# Patient Record
Sex: Female | Born: 1980 | Race: Black or African American | Hispanic: No | Marital: Single | Smoking: Current every day smoker
Health system: Southern US, Community
[De-identification: ages and names within clinical notes are randomized; demographics above are authoritative.]

## PROBLEM LIST (undated history)

## (undated) DIAGNOSIS — Z8619 Personal history of other infectious and parasitic diseases: Secondary | ICD-10-CM

## (undated) DIAGNOSIS — N9489 Other specified conditions associated with female genital organs and menstrual cycle: Secondary | ICD-10-CM

## (undated) HISTORY — PX: NO PAST SURGERIES: SHX2092

---

## 2015-09-17 ENCOUNTER — Emergency Department (HOSPITAL_COMMUNITY)
Admission: EM | Admit: 2015-09-17 | Discharge: 2015-09-17 | Disposition: A | Discharge status: Home / Self Care | Payer: Self-pay | Source: Home / Self Care | Attending: Family Medicine | Admitting: Family Medicine

## 2015-09-17 ENCOUNTER — Encounter (HOSPITAL_COMMUNITY): Payer: Self-pay | Admitting: Emergency Medicine

## 2015-09-17 ENCOUNTER — Encounter (HOSPITAL_COMMUNITY): Payer: Self-pay | Admitting: Family Medicine

## 2015-09-17 ENCOUNTER — Emergency Department (HOSPITAL_COMMUNITY)
Admission: EM | Admit: 2015-09-17 | Discharge: 2015-09-17 | Disposition: A | Discharge status: Home / Self Care | Payer: Self-pay | Attending: Emergency Medicine | Admitting: Emergency Medicine

## 2015-09-17 DIAGNOSIS — R109 Unspecified abdominal pain: Secondary | ICD-10-CM | POA: Insufficient documentation

## 2015-09-17 DIAGNOSIS — F17200 Nicotine dependence, unspecified, uncomplicated: Secondary | ICD-10-CM | POA: Insufficient documentation

## 2015-09-17 DIAGNOSIS — J029 Acute pharyngitis, unspecified: Secondary | ICD-10-CM

## 2015-09-17 DIAGNOSIS — R102 Pelvic and perineal pain: Secondary | ICD-10-CM

## 2015-09-17 LAB — COMPREHENSIVE METABOLIC PANEL
ALK PHOS: 61 U/L (ref 38–126)
ALT: 19 U/L (ref 14–54)
ANION GAP: 9 (ref 5–15)
AST: 22 U/L (ref 15–41)
Albumin: 3.2 g/dL — ABNORMAL LOW (ref 3.5–5.0)
BILIRUBIN TOTAL: 0.7 mg/dL (ref 0.3–1.2)
BUN: 5 mg/dL — ABNORMAL LOW (ref 6–20)
CALCIUM: 9.5 mg/dL (ref 8.9–10.3)
CO2: 25 mmol/L (ref 22–32)
CREATININE: 0.67 mg/dL (ref 0.44–1.00)
Chloride: 103 mmol/L (ref 101–111)
Glucose, Bld: 111 mg/dL — ABNORMAL HIGH (ref 65–99)
Potassium: 4.4 mmol/L (ref 3.5–5.1)
SODIUM: 137 mmol/L (ref 135–145)
TOTAL PROTEIN: 7.3 g/dL (ref 6.5–8.1)

## 2015-09-17 LAB — I-STAT BETA HCG BLOOD, ED (MC, WL, AP ONLY)

## 2015-09-17 LAB — POCT URINALYSIS DIP (DEVICE)
BILIRUBIN URINE: NEGATIVE
Glucose, UA: NEGATIVE mg/dL
Ketones, ur: NEGATIVE mg/dL
NITRITE: NEGATIVE
PH: 8.5 — AB (ref 5.0–8.0)
Protein, ur: NEGATIVE mg/dL
Specific Gravity, Urine: 1.02 (ref 1.005–1.030)
UROBILINOGEN UA: 1 mg/dL (ref 0.0–1.0)

## 2015-09-17 LAB — CBC
HCT: 33.6 % — ABNORMAL LOW (ref 36.0–46.0)
HEMOGLOBIN: 10.7 g/dL — AB (ref 12.0–15.0)
MCH: 23.8 pg — ABNORMAL LOW (ref 26.0–34.0)
MCHC: 31.8 g/dL (ref 30.0–36.0)
MCV: 74.8 fL — ABNORMAL LOW (ref 78.0–100.0)
PLATELETS: 360 10*3/uL (ref 150–400)
RBC: 4.49 MIL/uL (ref 3.87–5.11)
RDW: 18 % — ABNORMAL HIGH (ref 11.5–15.5)
WBC: 10.3 10*3/uL (ref 4.0–10.5)

## 2015-09-17 LAB — LIPASE, BLOOD: Lipase: 30 U/L (ref 11–51)

## 2015-09-17 MED ORDER — NAPROXEN SODIUM 550 MG PO TABS: 550.0000 mg | ORAL_TABLET | Freq: Two times a day (BID) | ORAL | Status: DC

## 2015-09-17 MED ORDER — KETOROLAC TROMETHAMINE 60 MG/2ML IM SOLN
INTRAMUSCULAR | Status: AC
Start: 2015-09-17 — End: 2015-09-17
  Filled 2015-09-17: qty 2

## 2015-09-17 MED ORDER — KETOROLAC TROMETHAMINE 60 MG/2ML IM SOLN
60.0000 mg | Freq: Once | INTRAMUSCULAR | Status: AC
  Administered 2015-09-17: 60 mg via INTRAMUSCULAR

## 2015-09-17 NOTE — ED Notes (Signed)
Pt here for right side pain, urinary frequency, sts back pain and pressure.

## 2015-09-17 NOTE — ED Notes (Signed)
Low abdomen, low back pain for 3 days.  Pressure with urination.  Last bm was this morning and normal .  Denies vaginal discharge .  History of uti, but has not felt like this before.  Reports nausea, no vomiting

## 2015-09-17 NOTE — ED Notes (Signed)
Patients come up to nurse first a little irritated about current wait time. Proceeds to throw BP cuff on the counter and walks out the door

## 2015-09-17 NOTE — Discharge Instructions (Signed)
Pelvic Pain, Female Pelvic pain is pain felt below the belly button and between your hips. It can be caused by many different things. It is important to get help right away. This is especially true for severe, sharp, or unusual pain that comes on suddenly.  HOME CARE  Only take medicine as told by your doctor.  Rest as told by your doctor.  Eat a healthy diet, such as fruits, vegetables, and lean meats.  Drink enough fluids to keep your pee (urine) clear or pale yellow, or as told.  Avoid sex (intercourse) if it causes pain.  Apply warm or cold packs to your lower belly (abdomen). Use the type of pack that helps the pain.  Avoid situations that cause you stress.  Keep a journal to track your pain. Write down:  When the pain started.  Where it is located.  If there are things that seem to be related to the pain, such as food or your period.  Follow up with your doctor as told. GET HELP RIGHT AWAY IF:   You have heavy bleeding from the vagina.  You have more pelvic pain.  You feel lightheaded or pass out (faint).  You have chills.  You have pain when you pee or have blood in your pee.  You cannot stop having watery poop (diarrhea).  You cannot stop throwing up (vomiting).  You have a fever or lasting symptoms for more than 3 days.  You have a fever and your symptoms suddenly get worse.  You are being physically or sexually abused.  Your medicine does not help your pain.  You have fluid (discharge) coming from your vagina that is not normal. MAKE SURE YOU:  Understand these instructions.  Will watch your condition.  Will get help if you are not doing well or get worse.   This information is not intended to replace advice given to you by your health care provider. Make sure you discuss any questions you have with your health care provider.   Document Released: 12/22/2007 Document Revised: 07/26/2014 Document Reviewed: 10/25/2011 Elsevier Interactive  Patient Education 2016 Elsevier Inc. Abdominal Pain, Adult Many things can cause belly (abdominal) pain. Most times, the belly pain is not dangerous. Many cases of belly pain can be watched and treated at home. HOME CARE   Do not take medicines that help you go poop (laxatives) unless told to by your doctor.  Only take medicine as told by your doctor.  Eat or drink as told by your doctor. Your doctor will tell you if you should be on a special diet. GET HELP IF:  You do not know what is causing your belly pain.  You have belly pain while you are sick to your stomach (nauseous) or have runny poop (diarrhea).  You have pain while you pee or poop.  Your belly pain wakes you up at night.  You have belly pain that gets worse or better when you eat.  You have belly pain that gets worse when you eat fatty foods.  You have a fever. GET HELP RIGHT AWAY IF:   The pain does not go away within 2 hours.  You keep throwing up (vomiting).  The pain changes and is only in the right or left part of the belly.  You have bloody or tarry looking poop. MAKE SURE YOU:   Understand these instructions.  Will watch your condition.  Will get help right away if you are not doing well or get worse.  This information is not intended to replace advice given to you by your health care provider. Make sure you discuss any questions you have with your health care provider.   Document Released: 12/22/2007 Document Revised: 07/26/2014 Document Reviewed: 03/14/2013 Elsevier Interactive Patient Education [employer]

## 2015-09-17 NOTE — ED Provider Notes (Signed)
CSN: [id number]     Arrival date & time 09/17/15  1649 History   First MD Initiated Contact with Patient 09/17/15 1749     Chief Complaint  Patient presents with  . Abdominal Pain   (Consider location/radiation/quality/duration/timing/severity/associated sxs/prior Treatment) HPI History obtained from patient:   LOCATION:right pelvic SEVERITY:6 DURATION:several days CONTEXT:sudden onset QUALITY: MODIFYING FACTORS:OTC meds without relief ASSOCIATED SYMPTOMS: some nausea TIMING:constant OCCUPATION:  History reviewed. No pertinent past medical history. History reviewed. No pertinent past surgical history. No family history on file. Social History  Substance Use Topics  . Smoking status: Current Every Day Smoker  . Smokeless tobacco: None  . Alcohol Use: No   OB History    No data available     Review of Systems ROS +'ve abdominal/pelvic pain  Denies: HEADACHE, NAUSEA,   CHEST PAIN, CONGESTION, DYSURIA, SHORTNESS OF BREATH   Allergies  Review of patient's allergies indicates no known allergies.  Home Medications   Prior to Admission medications   Medication Sig Start Date End Date Taking? Authorizing Provider  Naproxen Sodium (ALEVE PO) Take by mouth.   Yes Historical Provider, MD  naproxen sodium (ANAPROX DS) 550 MG tablet Take 1 tablet (550 mg total) by mouth 2 (two) times daily with a meal. 09/17/15   Tharon Aquas, PA   Meds Ordered and Administered this Visit   Medications  ketorolac (TORADOL) injection 60 mg (60 mg Intramuscular Given 09/17/15 1810)    BP 115/74 mmHg  Pulse 87  Temp(Src) 100 F (37.8 C) (Oral)  Resp 18  SpO2 100%  LMP 09/03/2015 No data found.   Physical Exam NURSES NOTES AND VITAL SIGNS REVIEWED. CONSTITUTIONAL: Well developed, well nourished, no acute distress HEENT: normocephalic, atraumatic, right and left TM's are normal EYES: Conjunctiva normal NECK:normal ROM, supple, no adenopathy PULMONARY:No respiratory distress, normal  effort, Lungs: CTAb/l, no wheezes, or increased work of breathing CARDIOVASCULAR: RRR, no murmur ABDOMEN: soft, ND, NT, +'ve BS, without CVA -T MUSCULOSKELETAL: Normal ROM of all extremities,  SKIN: warm and dry without rash PSYCHIATRIC: Mood and affect, behavior are normal No pelvic exam performed.  ED Course  Procedures (including critical care time)  Labs Review Labs Reviewed  POCT URINALYSIS DIP (DEVICE) - Abnormal; Notable for the following:    Hgb urine dipstick SMALL (*)    pH 8.5 (*)    Leukocytes, UA SMALL (*)    All other components within normal limits    Imaging Review No results found.   Visual Acuity Review  Right Eye Distance:   Left Eye Distance:   Bilateral Distance:    Right Eye Near:   Left Eye Near:    Bilateral Near:       Discussed UA, Urine HCG and bloods done at ER all normal. I have advised pt that if she continues to have discomfort, she should go to [facility] hospital for further evaluation.   MDM   1. Pelvic pain in female     Patient is reassured that there are no issues that require transfer to higher level of care at this time.  Patient is advised to continue home symptomatic treatment. Prescription is sent to  pharmacy patient has indicated.  anaprox Patient is advised that if there are new or worsening symptoms or attend the emergency department, or contact primary care provider. Instructions of care provided discharged home in stable condition. Return to work/school note provided.  THIS NOTE WAS GENERATED USING A VOICE RECOGNITION SOFTWARE PROGRAM. ALL REASONABLE EFFORTS  WERE  MADE TO PROOFREAD THIS DOCUMENT FOR ACCURACY.     Tharon Aquas, PA 09/17/15 2023

## 2015-09-18 ENCOUNTER — Emergency Department (HOSPITAL_COMMUNITY)
Admission: EM | Admit: 2015-09-18 | Discharge: 2015-09-18 | Disposition: A | Discharge status: Home / Self Care | Payer: Self-pay | Attending: Emergency Medicine | Admitting: Emergency Medicine

## 2015-09-18 ENCOUNTER — Emergency Department (HOSPITAL_COMMUNITY): Payer: Self-pay

## 2015-09-18 ENCOUNTER — Encounter (HOSPITAL_COMMUNITY): Payer: Self-pay

## 2015-09-18 DIAGNOSIS — Z791 Long term (current) use of non-steroidal anti-inflammatories (NSAID): Secondary | ICD-10-CM | POA: Insufficient documentation

## 2015-09-18 DIAGNOSIS — N730 Acute parametritis and pelvic cellulitis: Secondary | ICD-10-CM

## 2015-09-18 DIAGNOSIS — N7093 Salpingitis and oophoritis, unspecified: Secondary | ICD-10-CM | POA: Insufficient documentation

## 2015-09-18 DIAGNOSIS — F17200 Nicotine dependence, unspecified, uncomplicated: Secondary | ICD-10-CM | POA: Insufficient documentation

## 2015-09-18 DIAGNOSIS — N739 Female pelvic inflammatory disease, unspecified: Secondary | ICD-10-CM | POA: Insufficient documentation

## 2015-09-18 DIAGNOSIS — Z3202 Encounter for pregnancy test, result negative: Secondary | ICD-10-CM | POA: Insufficient documentation

## 2015-09-18 DIAGNOSIS — R102 Pelvic and perineal pain: Secondary | ICD-10-CM

## 2015-09-18 LAB — CBC WITH DIFFERENTIAL/PLATELET
Basophils Absolute: 0.1 10*3/uL (ref 0.0–0.1)
Basophils Relative: 1 %
EOS PCT: 1 %
Eosinophils Absolute: 0.1 10*3/uL (ref 0.0–0.7)
HCT: 32.6 % — ABNORMAL LOW (ref 36.0–46.0)
HEMOGLOBIN: 10.5 g/dL — AB (ref 12.0–15.0)
LYMPHS ABS: 2.1 10*3/uL (ref 0.7–4.0)
LYMPHS PCT: 20 %
MCH: 24.2 pg — AB (ref 26.0–34.0)
MCHC: 32.2 g/dL (ref 30.0–36.0)
MCV: 75.3 fL — AB (ref 78.0–100.0)
Monocytes Absolute: 1.6 10*3/uL — ABNORMAL HIGH (ref 0.1–1.0)
Monocytes Relative: 16 %
Neutro Abs: 6.4 10*3/uL (ref 1.7–7.7)
Neutrophils Relative %: 62 %
PLATELETS: 351 10*3/uL (ref 150–400)
RBC: 4.33 MIL/uL (ref 3.87–5.11)
RDW: 17.9 % — ABNORMAL HIGH (ref 11.5–15.5)
WBC: 10.1 10*3/uL (ref 4.0–10.5)

## 2015-09-18 LAB — BASIC METABOLIC PANEL
Anion gap: 13 (ref 5–15)
BUN: 6 mg/dL (ref 6–20)
CHLORIDE: 106 mmol/L (ref 101–111)
CO2: 24 mmol/L (ref 22–32)
Calcium: 9.5 mg/dL (ref 8.9–10.3)
Creatinine, Ser: 0.62 mg/dL (ref 0.44–1.00)
GFR calc Af Amer: >60 mL/min (ref >60)
GFR calc non Af Amer: >60 mL/min (ref >60)
GLUCOSE: 99 mg/dL (ref 65–99)
POTASSIUM: 4.8 mmol/L (ref 3.5–5.1)
Sodium: 143 mmol/L (ref 135–145)

## 2015-09-18 LAB — URINALYSIS, ROUTINE W REFLEX MICROSCOPIC
BILIRUBIN URINE: NEGATIVE
Glucose, UA: NEGATIVE mg/dL
HGB URINE DIPSTICK: NEGATIVE
Ketones, ur: NEGATIVE mg/dL
Nitrite: NEGATIVE
PROTEIN: NEGATIVE mg/dL
Specific Gravity, Urine: 1.017 (ref 1.005–1.030)
pH: 7.5 (ref 5.0–8.0)

## 2015-09-18 LAB — URINE MICROSCOPIC-ADD ON: RBC / HPF: NONE SEEN RBC/hpf (ref 0–5)

## 2015-09-18 LAB — WET PREP, GENITAL
SPERM: NONE SEEN
Trich, Wet Prep: NONE SEEN
Yeast Wet Prep HPF POC: NONE SEEN

## 2015-09-18 LAB — I-STAT BETA HCG BLOOD, ED (MC, WL, AP ONLY)

## 2015-09-18 MED ORDER — HYDROCODONE-ACETAMINOPHEN 5-325 MG PO TABS
1.0000 | ORAL_TABLET | Freq: Once | ORAL | Status: AC
Start: 2015-09-18 — End: 2015-09-18
  Administered 2015-09-18: 1 via ORAL
  Filled 2015-09-18: qty 1

## 2015-09-18 MED ORDER — ONDANSETRON HCL 4 MG PO TABS: 4.0000 mg | ORAL_TABLET | Freq: Three times a day (TID) | ORAL | Status: DC | PRN

## 2015-09-18 MED ORDER — MORPHINE SULFATE (PF) 4 MG/ML IV SOLN
4.0000 mg | Freq: Once | INTRAVENOUS | Status: DC
  Filled 2015-09-18: qty 1

## 2015-09-18 MED ORDER — CEFTRIAXONE SODIUM 250 MG IJ SOLR
250.0000 mg | Freq: Once | INTRAMUSCULAR | Status: AC
  Administered 2015-09-18: 250 mg via INTRAMUSCULAR
  Filled 2015-09-18: qty 250

## 2015-09-18 MED ORDER — HYDROCODONE-ACETAMINOPHEN 5-325 MG PO TABS: 1.0000 | ORAL_TABLET | ORAL | Status: DC | PRN

## 2015-09-18 MED ORDER — ONDANSETRON 4 MG PO TBDP
8.0000 mg | ORAL_TABLET | Freq: Once | ORAL | Status: AC
  Administered 2015-09-18: 8 mg via ORAL
  Filled 2015-09-18: qty 2

## 2015-09-18 MED ORDER — AZITHROMYCIN 250 MG PO TABS
1000.0000 mg | ORAL_TABLET | Freq: Once | ORAL | Status: AC
  Administered 2015-09-18: 1000 mg via ORAL
  Filled 2015-09-18: qty 4

## 2015-09-18 MED ORDER — STERILE WATER FOR INJECTION IJ SOLN
INTRAMUSCULAR | Status: AC
  Administered 2015-09-18: 0.9 mL
  Filled 2015-09-18: qty 10

## 2015-09-18 MED ORDER — METRONIDAZOLE 500 MG PO TABS: 500.0000 mg | ORAL_TABLET | Freq: Two times a day (BID) | ORAL | Status: DC

## 2015-09-18 MED ORDER — DOXYCYCLINE HYCLATE 100 MG PO CAPS: 100.0000 mg | ORAL_CAPSULE | Freq: Two times a day (BID) | ORAL | Status: DC

## 2015-09-18 NOTE — ED Notes (Signed)
Patient here with ongoing abdominal pain x 4 days, seen at urgent care yesterday and had urinalysis. Reports she had pain shot and last pm pain returned

## 2015-09-18 NOTE — Discharge Instructions (Signed)
Read the information below.  Use the prescribed medication as directed.  Please discuss all new medications with your pharmacist.  Do not take additional tylenol while taking the prescribed pain medication to avoid overdose.  You may return to the Emergency Department at any time for worsening condition or any new symptoms that concern you.    If you develop high fevers, worsening abdominal pain, uncontrolled vomiting, or are unable to tolerate fluids by mouth, return to the ER for a recheck.    You have a collection of pus in your pelvis and associated with your right ovary.  Please take all of the antibiotics until they are gone (2 weeks).  [facility] will call you with an appointment early next week.  Please go directly to [facility] for worsening condition.     Pelvic Inflammatory Disease Pelvic inflammatory disease (PID) refers to an infection in some or all of the female organs. The infection can be in the uterus, ovaries, fallopian tubes, or the surrounding tissues in the pelvis. PID can cause abdominal or pelvic pain that comes on suddenly (acute pelvic pain). PID is a serious infection because it can lead to lasting (chronic) pelvic pain or the inability to have children (infertility). CAUSES This condition is most often caused by an infection that is spread during sexual contact. However, the infection can also be caused by the normal bacteria that are found in the vaginal tissues if these bacteria travel upward into the reproductive organs. PID can also occur following:  The birth of a baby.  A miscarriage.  An abortion.  Major pelvic surgery.  The use of an intrauterine device (IUD).  A sexual assault. RISK FACTORS This condition is more likely to develop in women who:  Are younger than 35 years of age.  Are sexually active at [facility] age.  Use nonbarrier contraception.  Have multiple sexual partners.  Have sex with someone who has symptoms  of an STD (sexually transmitted disease).  Use oral contraception. At times, certain behaviors can also increase the possibility of getting PID, such as:  Using a vaginal douche.  Having an IUD in place. SYMPTOMS Symptoms of this condition include:  Abdominal or pelvic pain.  Fever.  Chills.  Abnormal vaginal discharge.  Abnormal uterine bleeding.  Unusual pain shortly after the end of a menstrual period.  Painful urination.  Pain with sexual intercourse.  Nausea and vomiting. DIAGNOSIS To diagnose this condition, your health care provider will do a physical exam and take your medical history. A pelvic exam typically reveals great tenderness in the uterus and the surrounding pelvic tissues. You may also have tests, such as:  Lab tests, including a pregnancy test, blood tests, and urine test.  Culture tests of the vagina and cervix to check for an STD.  Ultrasound.  A laparoscopic procedure to look inside the pelvis.  Examining vaginal secretions under a microscope. TREATMENT Treatment for this condition may involve one or more approaches.  Antibiotic medicines may be prescribed to be taken by mouth.  Sexual partners may need to be treated if the infection is caused by an STD.  For more severe cases, hospitalization may be needed to give antibiotics directly into a vein through an IV tube.  Surgery may be needed if other treatments do not help, but this is rare. It may take weeks until you are completely well. If you are diagnosed with PID, you should also be checked for human immunodeficiency virus (HIV). Your  health care provider may test you for infection again 3 months after treatment. You should not have unprotected sex. HOME CARE INSTRUCTIONS  Take over-the-counter and prescription medicines only as told by your health care provider.  If you were prescribed an antibiotic medicine, take it as told by your health care provider. Do not stop taking the  antibiotic even if you start to feel better.  Do not have sexual intercourse until treatment is completed or as told by your health care provider. If PID is confirmed, your recent sexual partners will need treatment, especially if you had unprotected sex.  Keep all follow-up visits as told by your health care provider. This is important. SEEK MEDICAL CARE IF:  You have increased or abnormal vaginal discharge.  Your pain does not improve.  You vomit.  You have a fever.  You cannot tolerate your medicines.  Your partner has an STD.  You have pain when you urinate. SEEK IMMEDIATE MEDICAL CARE IF:  You have increased abdominal or pelvic pain.  You have chills.  Your symptoms are not better in 72 hours even with treatment.   This information is not intended to replace advice given to you by your health care provider. Make sure you discuss any questions you have with your health care provider.   Document Released: 07/05/2005 Document Revised: 03/26/2015 Document Reviewed: 08/12/2014 Elsevier Interactive Patient Education 2016 [employer].  Sexually Transmitted Disease A sexually transmitted disease (STD) is a disease or infection that may be passed (transmitted) from person to person, usually during sexual activity. This may happen by way of saliva, semen, blood, vaginal mucus, or urine. Common STDs include:  Gonorrhea.  Chlamydia.  Syphilis.  HIV and AIDS.  Genital herpes.  Hepatitis B and C.  Trichomonas.  Human papillomavirus (HPV).  Pubic lice.  Scabies.  Mites.  Bacterial vaginosis. WHAT ARE CAUSES OF STDs? An STD may be caused by bacteria, a virus, or parasites. STDs are often transmitted during sexual activity if one person is infected. However, they may also be transmitted through nonsexual means. STDs may be transmitted after:   Sexual intercourse with an infected person.  Sharing sex toys with an infected person.  Sharing needles with an  infected person or using unclean piercing or tattoo needles.  Having intimate contact with the genitals, mouth, or rectal areas of an infected person.  Exposure to infected fluids during birth. WHAT ARE THE SIGNS AND SYMPTOMS OF STDs? Different STDs have different symptoms. Some people may not have any symptoms. If symptoms are present, they may include:  Painful or bloody urination.  Pain in the pelvis, abdomen, vagina, anus, throat, or eyes.  A skin rash, itching, or irritation.  Growths, ulcerations, blisters, or sores in the genital and anal areas.  Abnormal vaginal discharge with or without bad odor.  Penile discharge in men.  Fever.  Pain or bleeding during sexual intercourse.  Swollen glands in the groin area.  Yellow skin and eyes (jaundice). This is seen with hepatitis.  Swollen testicles.  Infertility.  Sores and blisters in the mouth. HOW ARE STDs DIAGNOSED? To make a diagnosis, your health care provider may:  Take a medical history.  Perform a physical exam.  Take a sample of any discharge to examine.  Swab the throat, cervix, opening to the penis, rectum, or vagina for testing.  Test a sample of your first morning urine.  Perform blood tests.  Perform a Pap test, if this applies.  Perform a colposcopy.  Perform  a laparoscopy. HOW ARE STDs TREATED? Treatment depends on the STD. Some STDs may be treated but not cured.  Chlamydia, gonorrhea, trichomonas, and syphilis can be cured with antibiotic medicine.  Genital herpes, hepatitis, and HIV can be treated, but not cured, with prescribed medicines. The medicines lessen symptoms.  Genital warts from HPV can be treated with medicine or by freezing, burning (electrocautery), or surgery. Warts may come back.  HPV cannot be cured with medicine or surgery. However, abnormal areas may be removed from the cervix, vagina, or vulva.  If your diagnosis is confirmed, your recent sexual partners need  treatment. This is true even if they are symptom-free or have a negative culture or evaluation. They should not have sex until their health care providers say it is okay.  Your health care provider may test you for infection again 3 months after treatment. HOW CAN I REDUCE MY RISK OF GETTING AN STD? Take these steps to reduce your risk of getting an STD:  Use latex condoms, dental dams, and water-soluble lubricants during sexual activity. Do not use petroleum jelly or oils.  Avoid having multiple sex partners.  Do not have sex with someone who has other sex partners  Do not have sex with anyone you do not know or who is at high risk for an STD.  Avoid risky sex practices that can break your skin.  Do not have sex if you have open sores on your mouth or skin.  Avoid drinking too much alcohol or taking illegal drugs. Alcohol and drugs can affect your judgment and put you in a vulnerable position.  Avoid engaging in oral and anal sex acts.  Get vaccinated for HPV and hepatitis. If you have not received these vaccines in the past, talk to your health care provider about whether one or both might be right for you.  If you are at risk of being infected with HIV, it is recommended that you take a prescription medicine daily to prevent HIV infection. This is called pre-exposure prophylaxis (PrEP). You are considered at risk if:  You are a man who has sex with other men (MSM).  You are a heterosexual man or woman and are sexually active with more than one partner.  You take drugs by injection.  You are sexually active with a partner who has HIV.  Talk with your health care provider about whether you are at high risk of being infected with HIV. If you choose to begin PrEP, you should first be tested for HIV. You should then be tested every 3 months for as long as you are taking PrEP. WHAT SHOULD I DO IF I THINK I HAVE AN STD?  See your health care provider.  Tell your sexual partner(s).  They should be tested and treated for any STDs.  Do not have sex until your health care provider says it is okay. WHEN SHOULD I GET IMMEDIATE MEDICAL CARE? Contact your health care provider right away if:   You have severe abdominal pain.  You are a man and notice swelling or pain in your testicles.  You are a woman and notice swelling or pain in your vagina.   This information is not intended to replace advice given to you by your health care provider. Make sure you discuss any questions you have with your health care provider.   Document Released: 09/25/2002 Document Revised: 07/26/2014 Document Reviewed: 01/23/2013 Elsevier Interactive Patient Education [employer]

## 2015-09-18 NOTE — ED Notes (Signed)
Pt stable, ambulatory, states understanding of discharge instructions 

## 2015-09-18 NOTE — ED Provider Notes (Signed)
CSN: [id number]     Arrival date & time 09/18/15  7253 History   First MD Initiated Contact with Patient 09/18/15 1034     Chief Complaint  Patient presents with  . Abdominal Pain     (Consider location/radiation/quality/duration/timing/severity/associated sxs/prior Treatment) The history is provided by the patient.      Patient presents with 4 days of lower abdominal pain that is sharp and constant, associated dysuria, urgency, decreased urination, bad odor from the vagina, constipation.  Was seen at urgent care yesterday after given toradol, which relieved symptoms temporarily but this morning symptoms worsened.  Noted to have fever yesterday.  Denies N/V, vaginal discharge.  Her female sexual partner of two years recently had a change in behavior, suggesting they both get tested for STIs and that they use condoms.  Denies abdominal surgeries.    History reviewed. No pertinent past medical history. History reviewed. No pertinent past surgical history. No family history on file. Social History  Substance Use Topics  . Smoking status: Current Every Day Smoker  . Smokeless tobacco: None  . Alcohol Use: No   OB History    No data available     Review of Systems  All other systems reviewed and are negative.     Allergies  Review of patient's allergies indicates no known allergies.  Home Medications   Prior to Admission medications   Medication Sig Start Date End Date Taking? Authorizing Provider  Naproxen Sodium (ALEVE PO) Take by mouth.    Historical Provider, MD  naproxen sodium (ANAPROX DS) 550 MG tablet Take 1 tablet (550 mg total) by mouth 2 (two) times daily with a meal. 09/17/15   Tharon Aquas, PA   BP 131/100 mmHg  Pulse 107  Temp(Src) 99.1 F (37.3 C)  Resp 16  Ht  (1.575 m)  Wt 68.947 kg  BMI 27.79 kg/m2  SpO2 100%  LMP 09/03/2015 Physical Exam  Constitutional: She appears well-developed and well-nourished. No distress.  HENT:  Head: Normocephalic  and atraumatic.  Neck: Neck supple.  Cardiovascular: Normal rate and regular rhythm.   Pulmonary/Chest: Effort normal and breath sounds normal. No respiratory distress. She has no wheezes. She has no rales.  Abdominal: Soft. She exhibits no distension. There is tenderness in the suprapubic area. There is no rigidity, no rebound, no guarding and no tenderness at McBurney's point.  Neurological: She is alert.  Skin: She is not diaphoretic.  Nursing note and vitals reviewed.   ED Course  Procedures (including critical care time) Labs Review Labs Reviewed  WET PREP, GENITAL - Abnormal; Notable for the following:    Clue Cells Wet Prep HPF POC PRESENT (*)    WBC, Wet Prep HPF POC MANY (*)    All other components within normal limits  URINALYSIS, ROUTINE W REFLEX MICROSCOPIC (NOT AT [facility]) - Abnormal; Notable for the following:    APPearance CLOUDY (*)    Leukocytes, UA SMALL (*)    All other components within normal limits  CBC WITH DIFFERENTIAL/PLATELET - Abnormal; Notable for the following:    Hemoglobin 10.5 (*)    HCT 32.6 (*)    MCV 75.3 (*)    MCH 24.2 (*)    RDW 17.9 (*)    Monocytes Absolute 1.6 (*)    All other components within normal limits  URINE MICROSCOPIC-ADD ON - Abnormal; Notable for the following:    Squamous Epithelial / LPF 6-30 (*)    Bacteria, UA FEW (*)  All other components within normal limits  BASIC METABOLIC PANEL  RPR  HIV ANTIBODY (ROUTINE TESTING)  I-STAT BETA HCG BLOOD, ED ([facility], [facility], [facility] ONLY)  GC/CHLAMYDIA PROBE AMP ([place]) NOT AT [facility]    Imaging Review US Transvaginal Non-ob  09/18/2015  CLINICAL DATA:  Acute abdominal pain. EXAM: TRANSABDOMINAL AND TRANSVAGINAL ULTRASOUND OF PELVIS DOPPLER ULTRASOUND OF OVARIES TECHNIQUE: Both transabdominal and transvaginal ultrasound examinations of the pelvis were performed. Transabdominal technique was performed for global imaging of the pelvis including uterus, ovaries, adnexal regions, and pelvic  cul-de-sac. It was necessary to proceed with endovaginal exam following the transabdominal exam to visualize the endometrium and ovaries. Color and duplex Doppler ultrasound was utilized to evaluate blood flow to the ovaries. COMPARISON:  None. FINDINGS: Uterus Measurements: 8.7 x 4.8 x 3.8 cm. No fibroids or other mass visualized. Endometrium Thickness: 12.6 mm which is within normal limits for patient of reproductive age. No focal abnormality visualized. Right ovary Measurements: 5.8 x 3.4 x 5.1 cm. 3.5 cm complex lesion is noted most consistent with abscess, but ovarian neoplasm cannot be excluded. Also noted is enlarged tubular and tortuous structure in right adnexal region consistent with hydrosalpinx or pyosalpinx. Left ovary Measurements: 3.0 x 2.1 x 2.0 cm. 4.6 cm complex cystic abnormality is noted. Pulsed Doppler evaluation of both ovaries demonstrates normal low-resistance arterial and venous waveforms. Other findings No abnormal free fluid. IMPRESSION: Probable large right adnexal hydrosalpinx or pyosalpinx is noted. 3.5 cm complex lesion is noted in right ovary which most likely represents abscess, although neoplasm cannot be excluded. MRI or CT scan is recommended further evaluation. Probable 4.6 cm complex exophytic left ovarian cystic lesion is noted which may represent hemorrhagic cyst. Followup ultrasound in 6-12 weeks is recommended to ensure resolution. Electronically Signed   By: Lupita Raider, M.D.   On: 09/18/2015 14:23   US Pelvis Complete  09/18/2015  CLINICAL DATA:  Acute abdominal pain. EXAM: TRANSABDOMINAL AND TRANSVAGINAL ULTRASOUND OF PELVIS DOPPLER ULTRASOUND OF OVARIES TECHNIQUE: Both transabdominal and transvaginal ultrasound examinations of the pelvis were performed. Transabdominal technique was performed for global imaging of the pelvis including uterus, ovaries, adnexal regions, and pelvic cul-de-sac. It was necessary to proceed with endovaginal exam following the  transabdominal exam to visualize the endometrium and ovaries. Color and duplex Doppler ultrasound was utilized to evaluate blood flow to the ovaries. COMPARISON:  None. FINDINGS: Uterus Measurements: 8.7 x 4.8 x 3.8 cm. No fibroids or other mass visualized. Endometrium Thickness: 12.6 mm which is within normal limits for patient of reproductive age. No focal abnormality visualized. Right ovary Measurements: 5.8 x 3.4 x 5.1 cm. 3.5 cm complex lesion is noted most consistent with abscess, but ovarian neoplasm cannot be excluded. Also noted is enlarged tubular and tortuous structure in right adnexal region consistent with hydrosalpinx or pyosalpinx. Left ovary Measurements: 3.0 x 2.1 x 2.0 cm. 4.6 cm complex cystic abnormality is noted. Pulsed Doppler evaluation of both ovaries demonstrates normal low-resistance arterial and venous waveforms. Other findings No abnormal free fluid. IMPRESSION: Probable large right adnexal hydrosalpinx or pyosalpinx is noted. 3.5 cm complex lesion is noted in right ovary which most likely represents abscess, although neoplasm cannot be excluded. MRI or CT scan is recommended further evaluation. Probable 4.6 cm complex exophytic left ovarian cystic lesion is noted which may represent hemorrhagic cyst. Followup ultrasound in 6-12 weeks is recommended to ensure resolution. Electronically Signed   By: Lupita Raider, M.D.   On: 09/18/2015 14:23   [place]  Art/ven Flow Abd Pelv Doppler  09/18/2015  CLINICAL DATA:  Acute abdominal pain. EXAM: TRANSABDOMINAL AND TRANSVAGINAL ULTRASOUND OF PELVIS DOPPLER ULTRASOUND OF OVARIES TECHNIQUE: Both transabdominal and transvaginal ultrasound examinations of the pelvis were performed. Transabdominal technique was performed for global imaging of the pelvis including uterus, ovaries, adnexal regions, and pelvic cul-de-sac. It was necessary to proceed with endovaginal exam following the transabdominal exam to visualize the endometrium and ovaries. Color and  duplex Doppler ultrasound was utilized to evaluate blood flow to the ovaries. COMPARISON:  None. FINDINGS: Uterus Measurements: 8.7 x 4.8 x 3.8 cm. No fibroids or other mass visualized. Endometrium Thickness: 12.6 mm which is within normal limits for patient of reproductive age. No focal abnormality visualized. Right ovary Measurements: 5.8 x 3.4 x 5.1 cm. 3.5 cm complex lesion is noted most consistent with abscess, but ovarian neoplasm cannot be excluded. Also noted is enlarged tubular and tortuous structure in right adnexal region consistent with hydrosalpinx or pyosalpinx. Left ovary Measurements: 3.0 x 2.1 x 2.0 cm. 4.6 cm complex cystic abnormality is noted. Pulsed Doppler evaluation of both ovaries demonstrates normal low-resistance arterial and venous waveforms. Other findings No abnormal free fluid. IMPRESSION: Probable large right adnexal hydrosalpinx or pyosalpinx is noted. 3.5 cm complex lesion is noted in right ovary which most likely represents abscess, although neoplasm cannot be excluded. MRI or CT scan is recommended further evaluation. Probable 4.6 cm complex exophytic left ovarian cystic lesion is noted which may represent hemorrhagic cyst. Followup ultrasound in 6-12 weeks is recommended to ensure resolution. Electronically Signed   By: Lupita Raider, M.D.   On: 09/18/2015 14:23      EKG Interpretation None       3:33 PM I spoke with Dr Erin Fulling.  As patient is nontoxic and tolerating PO, pt may be d/c home with 2 weeks of doxycycline and flagyl, follow up with [facility] early next week.  I have sent Dr Erin Fulling an email as requestedwith patient's information; she will have clinic call with appointment.     MDM   Final diagnoses:  Pelvic pain in female  PID (acute pelvic inflammatory disease)  Right tubo-ovarian abscess   Febrile pt with 4 days suprapubic pain, dysuria, bad odor from vagina, constipation.  Clinically has PID, also with right adnexal  tenderness.  US demonstrates right ovarian abscess.  Please see discussion above with gyn for treatment plan.  Labs significant for anemia (Hgb 10.5).  Pt is not pregnant.  Doubt UTI.  Pt advised this is highly likely related to STI and her sexual partner must be tested and treated.  D/C home with norco, zofran, flagyl, doxycyline, [facility] follow up.  Discussed result, findings, treatment, and follow up  with patient.  Pt given return precautions.  Pt verbalizes understanding and agrees with plan.       Trixie Dredge, PA-C 09/18/15 1707  Azalia Bilis, MD 09/18/15 [phone]

## 2015-09-19 LAB — GC/CHLAMYDIA PROBE AMP (~~LOC~~) NOT AT ARMC
Chlamydia: NEGATIVE
NEISSERIA GONORRHEA: NEGATIVE

## 2015-09-19 LAB — RPR: RPR: NONREACTIVE

## 2015-09-19 LAB — HIV ANTIBODY (ROUTINE TESTING W REFLEX): HIV SCREEN 4TH GENERATION: NONREACTIVE

## 2015-09-22 ENCOUNTER — Inpatient Hospital Stay (HOSPITAL_COMMUNITY)
Admission: AD | Admit: 2015-09-22 | Discharge: 2015-09-24 | DRG: 759 | Disposition: A | Discharge status: Home / Self Care | Payer: Self-pay | Source: Ambulatory Visit | Attending: Family Medicine | Admitting: Family Medicine

## 2015-09-22 ENCOUNTER — Encounter (HOSPITAL_COMMUNITY): Payer: Self-pay | Admitting: *Deleted

## 2015-09-22 ENCOUNTER — Encounter: Payer: Self-pay | Admitting: Obstetrics & Gynecology

## 2015-09-22 ENCOUNTER — Ambulatory Visit (INDEPENDENT_AMBULATORY_CARE_PROVIDER_SITE_OTHER): Payer: Self-pay | Admitting: Obstetrics & Gynecology

## 2015-09-22 VITALS — BP 116/78 | HR 89 | Temp 98.4°F | Wt 158.2 lb

## 2015-09-22 DIAGNOSIS — N83202 Unspecified ovarian cyst, left side: Secondary | ICD-10-CM | POA: Diagnosis present

## 2015-09-22 DIAGNOSIS — F17200 Nicotine dependence, unspecified, uncomplicated: Secondary | ICD-10-CM | POA: Diagnosis present

## 2015-09-22 DIAGNOSIS — Z720 Tobacco use: Secondary | ICD-10-CM

## 2015-09-22 DIAGNOSIS — N7093 Salpingitis and oophoritis, unspecified: Principal | ICD-10-CM | POA: Diagnosis present

## 2015-09-22 LAB — CBC WITH DIFFERENTIAL/PLATELET
Basophils Absolute: 0 10*3/uL (ref 0.0–0.1)
Basophils Relative: 0 %
Eosinophils Absolute: 0.2 10*3/uL (ref 0.0–0.7)
Eosinophils Relative: 1 %
HEMATOCRIT: 32.1 % — AB (ref 36.0–46.0)
HEMOGLOBIN: 10.5 g/dL — AB (ref 12.0–15.0)
LYMPHS ABS: 3.5 10*3/uL (ref 0.7–4.0)
LYMPHS PCT: 34 %
MCH: 24.5 pg — AB (ref 26.0–34.0)
MCHC: 32.7 g/dL (ref 30.0–36.0)
MCV: 75 fL — AB (ref 78.0–100.0)
MONOS PCT: 8 %
Monocytes Absolute: 0.8 10*3/uL (ref 0.1–1.0)
NEUTROS ABS: 5.9 10*3/uL (ref 1.7–7.7)
NEUTROS PCT: 57 %
Platelets: 396 10*3/uL (ref 150–400)
RBC: 4.28 MIL/uL (ref 3.87–5.11)
RDW: 18.2 % — ABNORMAL HIGH (ref 11.5–15.5)
WBC: 10.4 10*3/uL (ref 4.0–10.5)

## 2015-09-22 LAB — COMPREHENSIVE METABOLIC PANEL
ALK PHOS: 55 U/L (ref 38–126)
ALT: 22 U/L (ref 14–54)
ANION GAP: 6 (ref 5–15)
AST: 20 U/L (ref 15–41)
Albumin: 3.5 g/dL (ref 3.5–5.0)
BILIRUBIN TOTAL: 0.4 mg/dL (ref 0.3–1.2)
BUN: 11 mg/dL (ref 6–20)
CHLORIDE: 107 mmol/L (ref 101–111)
CO2: 26 mmol/L (ref 22–32)
Calcium: 8.8 mg/dL — ABNORMAL LOW (ref 8.9–10.3)
Creatinine, Ser: 0.54 mg/dL (ref 0.44–1.00)
GFR calc non Af Amer: >60 mL/min (ref >60)
Glucose, Bld: 86 mg/dL (ref 65–99)
POTASSIUM: 4.2 mmol/L (ref 3.5–5.1)
Sodium: 139 mmol/L (ref 135–145)
Total Protein: 7.2 g/dL (ref 6.5–8.1)

## 2015-09-22 LAB — SEDIMENTATION RATE: SED RATE: 49 mm/h — AB (ref 0–22)

## 2015-09-22 MED ORDER — DOXYCYCLINE HYCLATE 100 MG IV SOLR
100.0000 mg | Freq: Two times a day (BID) | INTRAVENOUS | Status: DC
  Administered 2015-09-22 – 2015-09-23 (×3): 100 mg via INTRAVENOUS
  Filled 2015-09-22 (×3): qty 100

## 2015-09-22 MED ORDER — CEFOTETAN DISODIUM 1 G IJ SOLR
1.0000 g | Freq: Two times a day (BID) | INTRAMUSCULAR | Status: DC
  Administered 2015-09-22 – 2015-09-24 (×5): 1 g via INTRAVENOUS
  Filled 2015-09-22 (×5): qty 1

## 2015-09-22 MED ORDER — ONDANSETRON HCL 4 MG/2ML IJ SOLN: 4.0000 mg | Freq: Four times a day (QID) | INTRAMUSCULAR | Status: DC | PRN

## 2015-09-22 MED ORDER — PRENATAL MULTIVITAMIN CH
1.0000 | ORAL_TABLET | Freq: Every day | ORAL | Status: DC
  Administered 2015-09-23 – 2015-09-24 (×2): 1 via ORAL
  Filled 2015-09-22 (×2): qty 1

## 2015-09-22 MED ORDER — ONDANSETRON HCL 4 MG PO TABS: 4.0000 mg | ORAL_TABLET | Freq: Four times a day (QID) | ORAL | Status: DC | PRN

## 2015-09-22 MED ORDER — IBUPROFEN 600 MG PO TABS
600.0000 mg | ORAL_TABLET | Freq: Four times a day (QID) | ORAL | Status: DC | PRN
  Administered 2015-09-22 – 2015-09-24 (×5): 600 mg via ORAL
  Filled 2015-09-22 (×5): qty 1

## 2015-09-22 MED ORDER — OXYCODONE-ACETAMINOPHEN 5-325 MG PO TABS
1.0000 | ORAL_TABLET | ORAL | Status: DC | PRN
  Administered 2015-09-22: 1 via ORAL
  Administered 2015-09-22: 2 via ORAL
  Administered 2015-09-22: 1 via ORAL
  Administered 2015-09-23 – 2015-09-24 (×8): 2 via ORAL
  Filled 2015-09-22: qty 2
  Filled 2015-09-22: qty 1
  Filled 2015-09-22 (×7): qty 2
  Filled 2015-09-22: qty 1
  Filled 2015-09-22: qty 2

## 2015-09-22 NOTE — Patient Instructions (Signed)

## 2015-09-22 NOTE — H&P (Signed)
[facility] History and Physical  Aimee Shepard RPR:[id number] DOB: 04-15-1981 DOA: 09/22/2015  Referring physician: Dr Roselie Awkward, PCP: No primary care provider on file.   Chief Complaint: pelvic pain  HPI: Aimee Shepard is a 35 y.o. female  Who has admitted from the clinic due to right lower abdominal pain that started 8 days ago and has been worsening.  She was seen in the [facility] ED on 3/2, where a pelvic US showed a 3.0cm right TOA.  She was prescribed flagyl and doxycycline and was told to follow up in the women's outpatient clinic.  Her pain has continued without improvement.  No palliating or provoking factors.  Pain not improved with antibiotics.   Review of Systems:  Pt complains of nausea.  Pt denies any fevers, chills, vomiting, diarrhea, constipation, abdominal pain, shortness of breath, dyspnea on exertion, orthopnea, cough, wheezing, palpitations, headache, vision changes, lightheadedness, dizziness, diarrhea, constipation, melena, rectal bleeding.  Review of systems are otherwise negative  No past medical history on file. No past surgical history on file. Social History:  reports that she has been smoking.  She does not have any smokeless tobacco history on file. She reports that she does not drink alcohol. Her drug history is not on file. Patient lives at home & is able to participate in activities of daily living  No Known Allergies  Patient unaware of family history  Prior to Admission medications   Medication Sig Start Date End Date Taking? Authorizing Provider  doxycycline (VIBRAMYCIN) 100 MG capsule Take 1 capsule (100 mg total) by mouth 2 (two) times daily. One po bid x 7 days 09/18/15  Yes Clayton Bibles, PA-C  HYDROcodone-acetaminophen (NORCO/VICODIN) 5-325 MG tablet Take 1-2 tablets by mouth every 4 (four) hours as needed for moderate pain or severe pain. 09/18/15  Yes Clayton Bibles, PA-C  metroNIDAZOLE (FLAGYL) 500 MG tablet Take 1 tablet (500  mg total) by mouth 2 (two) times daily. 09/18/15  Yes Clayton Bibles, PA-C  naproxen sodium (ANAPROX DS) 550 MG tablet Take 1 tablet (550 mg total) by mouth 2 (two) times daily with a meal. 09/17/15  Yes Konrad Felix, PA    Physical Exam: LMP 09/03/2015  General: Aimee Shepard black female. Awake and alert and oriented x3. No acute cardiopulmonary distress.  Eyes: Pupils equal, round, reactive to light. Extraocular muscles are intact. Sclerae anicteric and noninjected.  ENT:  Moist mucosal membranes. No mucosal lesions. Teeth in poor repair  Neck: Neck supple without lymphadenopathy. No carotid bruits. No masses palpated.  Cardiovascular: Regular rate with normal S1-S2 sounds. No murmurs, rubs, gallops auscultated. No JVD.  Respiratory: Good respiratory effort with no wheezes, rales, rhonchi. Lungs clear to auscultation bilaterally.  Abdomen: Tenderness to right lower quadrant with guarding.  No rebound tenderness.  Nondistended. Active bowel sounds. No masses or hepatosplenomegaly  Skin: Dry, warm to touch. 2+ dorsalis pedis and radial pulses. Musculoskeletal: No calf or leg pain. All major joints not erythematous nontender.  Psychiatric: Intact judgment and insight.  Neurologic: No focal neurological deficits. Cranial nerves II through XII are grossly intact.           Labs on Admission:  Basic Metabolic Panel:  Recent Labs Lab 09/17/15 1022 09/18/15 1113  NA 137 143  K 4.4 4.8  CL 103 106  CO2 25 24  GLUCOSE 111* 99  BUN 5* 6  CREATININE 0.67 0.62  CALCIUM 9.5 9.5   Liver Function Tests:  Recent Labs Lab 09/17/15 1022  AST 22  ALT 19  ALKPHOS 61  BILITOT 0.7  PROT 7.3  ALBUMIN 3.2*    Recent Labs Lab 09/17/15 1022  LIPASE 30   No results for input(s): AMMONIA in the last 168 hours. CBC:  Recent Labs Lab 09/17/15 1022 09/18/15 1113  WBC 10.3 10.1  NEUTROABS  --  6.4  HGB 10.7* 10.5*  HCT 33.6* 32.6*  MCV 74.8* 75.3*  PLT 360 351   Cardiac Enzymes: No  results for input(s): CKTOTAL, CKMB, CKMBINDEX, TROPONINI in the last 168 hours.  BNP (last 3 results) No results for input(s): BNP in the last 8760 hours.  ProBNP (last 3 results) No results for input(s): PROBNP in the last 8760 hours.  CBG: No results for input(s): GLUCAP in the last 168 hours.  Radiological Exams on Admission: No results found.   Assessment/Plan Present on Admission:  . TOA (tubo-ovarian abscess)   Admit  Cefotetan/doxycycline  Check CBC with diff, ESR, CMP  Ibuprofen and percocet for pain  Repeat cbc in AM  DVT prophylaxis: SCD  Consultants: none  Code Status: full  Family Communication: none   Disposition Plan: admit   Truett Mainland, DO [facility] Pager [phone]

## 2015-09-22 NOTE — Progress Notes (Signed)
Patient ID: Aimee Shepard, female   DOB: 1980/07/25, 35 y.o.   MRN: [id number]  Chief Complaint  Patient presents with  . Follow-up  RLQ pain after treatment for PID 3/2 in ED  HPI Aimee PrudeGloria Eager is a 35 y.o. female.  No obstetric history on file. Patient's last menstrual period was 09/03/2015. Presented 3/2 to Urgent Care with 4 days of RLQ pain fever and nausea. She was sent to ED for evaluation and was Dx with PID, R TOA. She is only marginally improved on OP therapy, still nauseated no fever.  HPI  No past medical history on file. Hx trichomonas 6 mo ago No past surgical history on file.  No family history on file.  Social History Social History  Substance Use Topics  . Smoking status: Current Every Day Smoker  . Smokeless tobacco: None  . Alcohol Use: No    No Known Allergies  Current Outpatient Prescriptions  Medication Sig Dispense Refill  . doxycycline (VIBRAMYCIN) 100 MG capsule Take 1 capsule (100 mg total) by mouth 2 (two) times daily. One po bid x 7 days 28 capsule 0  . HYDROcodone-acetaminophen (NORCO/VICODIN) 5-325 MG tablet Take 1-2 tablets by mouth every 4 (four) hours as needed for moderate pain or severe pain. 20 tablet 0  . metroNIDAZOLE (FLAGYL) 500 MG tablet Take 1 tablet (500 mg total) by mouth 2 (two) times daily. 28 tablet 0  . naproxen sodium (ANAPROX DS) 550 MG tablet Take 1 tablet (550 mg total) by mouth 2 (two) times daily with a meal. 30 tablet 0  . ondansetron (ZOFRAN) 4 MG tablet Take 1 tablet (4 mg total) by mouth every 8 (eight) hours as needed for nausea or vomiting. (Patient not taking: Reported on 09/22/2015) 20 tablet 0   No current facility-administered medications for this visit.    Review of Systems Review of Systems  Constitutional: Positive for fever (resolved).  Gastrointestinal: Positive for nausea and abdominal pain.  Genitourinary: Positive for vaginal discharge and pelvic pain. Negative for frequency and menstrual problem.     Blood pressure 116/78, pulse 89, temperature 98.4 F (36.9 C), temperature source Oral, weight 158 lb 3.2 oz (71.759 kg), last menstrual period 09/03/2015.  Physical Exam Physical Exam  Constitutional: She is oriented to person, place, and time. She appears well-developed. She appears distressed (mild discomfort).  Cardiovascular: Normal rate.   Pulmonary/Chest: Effort normal.  Genitourinary: Vaginal discharge (pink, purulent ) found.  CMY, right adnexal tenderness and fullness, mass  Neurological: She is alert and oriented to person, place, and time.  Skin: Skin is warm and dry.  Psychiatric: She has a normal mood and affect. Her behavior is normal.    Data Reviewed US 3/2 right TOA  Assessment    Right TOA not improved on OP therapy     Plan    Admission for IV ABX , Dr. Adrian BlackwaterStinson called        Demarus Latterell 09/22/2015, 3:19 PM

## 2015-09-23 ENCOUNTER — Encounter (HOSPITAL_COMMUNITY): Payer: Self-pay | Admitting: Registered Nurse

## 2015-09-23 DIAGNOSIS — Z720 Tobacco use: Secondary | ICD-10-CM | POA: Insufficient documentation

## 2015-09-23 LAB — CBC
HCT: 29.4 % — ABNORMAL LOW (ref 36.0–46.0)
Hemoglobin: 9.7 g/dL — ABNORMAL LOW (ref 12.0–15.0)
MCH: 24.3 pg — ABNORMAL LOW (ref 26.0–34.0)
MCHC: 33 g/dL (ref 30.0–36.0)
MCV: 73.7 fL — AB (ref 78.0–100.0)
PLATELETS: 366 10*3/uL (ref 150–400)
RBC: 3.99 MIL/uL (ref 3.87–5.11)
RDW: 18.8 % — AB (ref 11.5–15.5)
WBC: 9.2 10*3/uL (ref 4.0–10.5)

## 2015-09-23 MED ORDER — DOXYCYCLINE HYCLATE 100 MG PO TABS
100.0000 mg | ORAL_TABLET | Freq: Two times a day (BID) | ORAL | Status: DC
  Administered 2015-09-23 – 2015-09-24 (×3): 100 mg via ORAL
  Filled 2015-09-23 (×4): qty 1

## 2015-09-23 MED ORDER — NICOTINE 14 MG/24HR TD PT24
14.0000 mg | MEDICATED_PATCH | Freq: Every day | TRANSDERMAL | Status: DC
  Filled 2015-09-23 (×3): qty 1

## 2015-09-23 NOTE — Care Management Note (Signed)
Case Management Note  Patient Details  Name: Aimee PrudeGloria Graziosi MRN: [id number] Date of Birth: 1980-12-16                 Expected Discharge Date:       09/24/15           :     In-House Referral:  Financial Counselor  Discharge planning Services  CM Consult, Follow-up appt scheduled   Status of Service:  Completed, signed off   CM saw patient today in room in length.  Patient has moved up here from [place].  Patient works part-time at [employer] in [place] and does not have [employer] insurance and she lives alone. CM [facility].   She states she does not have a PCP and would like to have one.  [facility] # [phone]- so CM called and made appointment while in patient's room for patient on 09/29/15 at 9:00 (arrive at 8:45) with Dr. Julien NordmannLangeland- address [address], [place] [id number], appointment made with receptionist- Aldean JewettGenova.   CM [facility] pharmacy [facility] # [phone] and spoke to pharmacist "Sejal" and she stated that since patient has an appointment made she can get her prescriptions at [facility] at no charge $0 for one time - everything except narcotics.  CM called our pharmacy and had the default pharmacy in epic changed [facility] so when patient is discharged please send her prescriptions to there and please discharge between hours of 9:00 -4:00 so patient can get to the pharmacy in time- the pharmacy closes at 5:30 pm. Patient given names of the pharmacist and CM and phone numbers of [facility] and verbalizes understanding.       Geoffery LyonsGaines, Beauford Lando Brown, RN 09/23/2015, 2:17 PM

## 2015-09-23 NOTE — Progress Notes (Signed)
CSW received consult for "May need help with medications, financially at d/c."  Care Management department handles these issues.  CSW contacted L. Probation officerGaines/Care Manager to inform her of consult.  CSW screening out consult at this time.

## 2015-09-23 NOTE — Progress Notes (Signed)
Called to bedside to start an IV after the patients previous IV infiltrated.  Pt refused to let me touch her or start her IV.

## 2015-09-23 NOTE — Progress Notes (Signed)
Subjective: Patient reports no problems voiding.  Her pain is decreasing.  Percocet is helpful.  Day 2 of antibiotics.  Nausea improved.  Objective: I have reviewed patient's vital signs.  General: alert, cooperative and no distress Resp: clear to auscultation bilaterally Cardio: regular rate and rhythm, S1, S2 normal, no murmur, click, rub or gallop GI: soft, non-tender; bowel sounds normal; no masses,  no organomegaly Extremities: extremities normal, atraumatic, no cyanosis or edema   CBC Latest Ref Rng 09/23/2015 09/22/2015 09/18/2015  WBC 4.0 - 10.5 K/uL 9.2 10.4 10.1  Hemoglobin 12.0 - 15.0 g/dL 1.6(X9.7(L) 10.5(L) 10.5(L)  Hematocrit 36.0 - 46.0 % 29.4(L) 32.1(L) 32.6(L)  Platelets 150 - 400 K/uL 366 396 351    Lab Results  Component Value Date   ESRSEDRATE 49* 09/22/2015     Assessment/Plan: #1 TOA Cefotetan and doxy Day 2.   Continue ibuprofen - limit narcotics If pain continues to improve, d/c home later today vs tomorrow. #2 Tobacco abuse Patient informed that she cannot go outside to smoke Offered patch - pt declined.   LOS: 1 day    STINSON, JACOB JEHIEL 09/23/2015, 7:38 AM

## 2015-09-24 ENCOUNTER — Encounter: Payer: Self-pay | Admitting: Obstetrics and Gynecology

## 2015-09-24 DIAGNOSIS — N7093 Salpingitis and oophoritis, unspecified: Principal | ICD-10-CM

## 2015-09-24 MED ORDER — DOXYCYCLINE HYCLATE 100 MG PO TABS: 100.0000 mg | ORAL_TABLET | Freq: Two times a day (BID) | ORAL | Status: DC

## 2015-09-24 MED ORDER — HYDROCODONE-ACETAMINOPHEN 5-325 MG PO TABS: 1.0000 | ORAL_TABLET | Freq: Four times a day (QID) | ORAL | Status: DC | PRN

## 2015-09-24 MED ORDER — METRONIDAZOLE 500 MG PO TABS: 500.0000 mg | ORAL_TABLET | Freq: Two times a day (BID) | ORAL | Status: DC

## 2015-09-24 MED ORDER — NAPROXEN SODIUM 550 MG PO TABS: 550.0000 mg | ORAL_TABLET | Freq: Two times a day (BID) | ORAL | Status: DC

## 2015-09-24 MED FILL — metroNIDAZOLE 500 MG TABS: 500 | 14 days supply | Qty: 28 | Fill #0

## 2015-09-24 MED FILL — DOXYCYCLINE 100 MG TABLET: 100 | 28 days supply | Qty: 28 | Fill #0

## 2015-09-24 MED FILL — NAPROXEN SODIUM 550 MG TAB: 550 | 15 days supply | Qty: 30 | Fill #0

## 2015-09-24 NOTE — Progress Notes (Signed)
Discharge instructions complete

## 2015-09-24 NOTE — Progress Notes (Signed)
Subjective: Patient reports tolerating PO.  Feels 90% better  Objective: Blood pressure 118/67, pulse 71, temperature 98.7 F (37.1 C), temperature source Oral, resp. rate 16, height 5\' 2"  (1.575 m), weight 71.753 kg (158 lb 3 oz), last menstrual period 09/03/2015, SpO2 99 %.  I have reviewed patient's vital signs, medications and labs.  General: alert, cooperative and no distress GI: soft, non-tender; bowel sounds normal; no masses,  no organomegaly   Assessment/Plan: Doing well on IV abx for right TOA Finish 48 hr of IV antibiotics and consider discharge  LOS: 2 days    Aimee Shepard 09/24/2015, 7:51 AM

## 2015-09-24 NOTE — Discharge Summary (Signed)
Physician Discharge Summary  Patient ID: Aimee Shepard MRN: [id number] DOB/AGE: 1981-02-26 35 y.o.  Admit date: 09/22/2015 Discharge date: 09/24/2015  Admission Diagnoses:  Discharge Diagnoses:  Principal Problem:   TOA (tubo-ovarian abscess)  Discharged Condition: Stable  Hospital Course: Patient was admitted for treatment of TOA; she received 48 hours of IV cefotetan and Doxycycline.  She remained afebrile, pain was controlled on oral analgesics.  She was discharged to home on oral Doxycycline and Metronidazole for 14 days.  Significant Diagnostic Studies:  Results for orders placed or performed during the hospital encounter of 09/22/15 (from the past 72 hour(s))  CBC WITH DIFFERENTIAL     Status: Abnormal   Collection Time: 09/22/15  5:11 PM  Result Value Ref Range   WBC 10.4 4.0 - 10.5 K/uL   RBC 4.28 3.87 - 5.11 MIL/uL   Hemoglobin 10.5 (L) 12.0 - 15.0 g/dL   HCT 32.1 (L) 36.0 - 46.0 %   MCV 75.0 (L) 78.0 - 100.0 fL   MCH 24.5 (L) 26.0 - 34.0 pg   MCHC 32.7 30.0 - 36.0 g/dL   RDW 18.2 (H) 11.5 - 15.5 %   Platelets 396 150 - 400 K/uL   Neutrophils Relative % 57 %   Neutro Abs 5.9 1.7 - 7.7 K/uL   Lymphocytes Relative 34 %   Lymphs Abs 3.5 0.7 - 4.0 K/uL   Monocytes Relative 8 %   Monocytes Absolute 0.8 0.1 - 1.0 K/uL   Eosinophils Relative 1 %   Eosinophils Absolute 0.2 0.0 - 0.7 K/uL   Basophils Relative 0 %   Basophils Absolute 0.0 0.0 - 0.1 K/uL  Comprehensive metabolic panel     Status: Abnormal   Collection Time: 09/22/15  5:11 PM  Result Value Ref Range   Sodium 139 135 - 145 mmol/L   Potassium 4.2 3.5 - 5.1 mmol/L   Chloride 107 101 - 111 mmol/L   CO2 26 22 - 32 mmol/L   Glucose, Bld 86 65 - 99 mg/dL   BUN 11 6 - 20 mg/dL   Creatinine, Ser 0.54 0.44 - 1.00 mg/dL   Calcium 8.8 (L) 8.9 - 10.3 mg/dL   Total Protein 7.2 6.5 - 8.1 g/dL   Albumin 3.5 3.5 - 5.0 g/dL   AST 20 15 - 41 U/L   ALT 22 14 - 54 U/L   Alkaline Phosphatase 55 38 - 126 U/L   Total  Bilirubin 0.4 0.3 - 1.2 mg/dL   GFR calc non Af Amer >60 >60 mL/min   GFR calc Af Amer >60 >60 mL/min    Comment: (NOTE) The eGFR has been calculated using the CKD EPI equation. This calculation has not been validated in all clinical situations. eGFR's persistently <60 mL/min signify possible Chronic Kidney Disease.    Anion gap 6 5 - 15  Sedimentation rate     Status: Abnormal   Collection Time: 09/22/15  5:11 PM  Result Value Ref Range   Sed Rate 49 (H) 0 - 22 mm/hr    Comment: Performed at [facility]  CBC     Status: Abnormal   Collection Time: 09/23/15  5:38 AM  Result Value Ref Range   WBC 9.2 4.0 - 10.5 K/uL   RBC 3.99 3.87 - 5.11 MIL/uL   Hemoglobin 9.7 (L) 12.0 - 15.0 g/dL   HCT 29.4 (L) 36.0 - 46.0 %   MCV 73.7 (L) 78.0 - 100.0 fL   MCH 24.3 (L) 26.0 - 34.0 pg  MCHC 33.0 30.0 - 36.0 g/dL   RDW 18.8 (H) 11.5 - 15.5 %   Platelets 366 150 - 400 K/uL    US Transvaginal Non-ob  09/18/2015  CLINICAL DATA:  Acute abdominal pain. EXAM: TRANSABDOMINAL AND TRANSVAGINAL ULTRASOUND OF PELVIS DOPPLER ULTRASOUND OF OVARIES TECHNIQUE: Both transabdominal and transvaginal ultrasound examinations of the pelvis were performed. Transabdominal technique was performed for global imaging of the pelvis including uterus, ovaries, adnexal regions, and pelvic cul-de-sac. It was necessary to proceed with endovaginal exam following the transabdominal exam to visualize the endometrium and ovaries. Color and duplex Doppler ultrasound was utilized to evaluate blood flow to the ovaries. COMPARISON:  None. FINDINGS: Uterus Measurements: 8.7 x 4.8 x 3.8 cm. No fibroids or other mass visualized. Endometrium Thickness: 12.6 mm which is within normal limits for patient of reproductive age. No focal abnormality visualized. Right ovary Measurements: 5.8 x 3.4 x 5.1 cm. 3.5 cm complex lesion is noted most consistent with abscess, but ovarian neoplasm cannot be excluded. Also noted is enlarged tubular and  tortuous structure in right adnexal region consistent with hydrosalpinx or pyosalpinx. Left ovary Measurements: 3.0 x 2.1 x 2.0 cm. 4.6 cm complex cystic abnormality is noted. Pulsed Doppler evaluation of both ovaries demonstrates normal low-resistance arterial and venous waveforms. Other findings No abnormal free fluid. IMPRESSION: Probable large right adnexal hydrosalpinx or pyosalpinx is noted. 3.5 cm complex lesion is noted in right ovary which most likely represents abscess, although neoplasm cannot be excluded. MRI or CT scan is recommended further evaluation. Probable 4.6 cm complex exophytic left ovarian cystic lesion is noted which may represent hemorrhagic cyst. Followup ultrasound in 6-12 weeks is recommended to ensure resolution. Electronically Signed   By: Marijo Conception, M.D.   On: 09/18/2015 14:23   US Pelvis Complete  09/18/2015  CLINICAL DATA:  Acute abdominal pain. EXAM: TRANSABDOMINAL AND TRANSVAGINAL ULTRASOUND OF PELVIS DOPPLER ULTRASOUND OF OVARIES TECHNIQUE: Both transabdominal and transvaginal ultrasound examinations of the pelvis were performed. Transabdominal technique was performed for global imaging of the pelvis including uterus, ovaries, adnexal regions, and pelvic cul-de-sac. It was necessary to proceed with endovaginal exam following the transabdominal exam to visualize the endometrium and ovaries. Color and duplex Doppler ultrasound was utilized to evaluate blood flow to the ovaries. COMPARISON:  None. FINDINGS: Uterus Measurements: 8.7 x 4.8 x 3.8 cm. No fibroids or other mass visualized. Endometrium Thickness: 12.6 mm which is within normal limits for patient of reproductive age. No focal abnormality visualized. Right ovary Measurements: 5.8 x 3.4 x 5.1 cm. 3.5 cm complex lesion is noted most consistent with abscess, but ovarian neoplasm cannot be excluded. Also noted is enlarged tubular and tortuous structure in right adnexal region consistent with hydrosalpinx or pyosalpinx.  Left ovary Measurements: 3.0 x 2.1 x 2.0 cm. 4.6 cm complex cystic abnormality is noted. Pulsed Doppler evaluation of both ovaries demonstrates normal low-resistance arterial and venous waveforms. Other findings No abnormal free fluid. IMPRESSION: Probable large right adnexal hydrosalpinx or pyosalpinx is noted. 3.5 cm complex lesion is noted in right ovary which most likely represents abscess, although neoplasm cannot be excluded. MRI or CT scan is recommended further evaluation. Probable 4.6 cm complex exophytic left ovarian cystic lesion is noted which may represent hemorrhagic cyst. Followup ultrasound in 6-12 weeks is recommended to ensure resolution. Electronically Signed   By: Marijo Conception, M.D.   On: 09/18/2015 14:23   [place] Art/ven Flow Abd Pelv Doppler  09/18/2015  CLINICAL DATA:  Acute abdominal pain. EXAM:  TRANSABDOMINAL AND TRANSVAGINAL ULTRASOUND OF PELVIS DOPPLER ULTRASOUND OF OVARIES TECHNIQUE: Both transabdominal and transvaginal ultrasound examinations of the pelvis were performed. Transabdominal technique was performed for global imaging of the pelvis including uterus, ovaries, adnexal regions, and pelvic cul-de-sac. It was necessary to proceed with endovaginal exam following the transabdominal exam to visualize the endometrium and ovaries. Color and duplex Doppler ultrasound was utilized to evaluate blood flow to the ovaries. COMPARISON:  None. FINDINGS: Uterus Measurements: 8.7 x 4.8 x 3.8 cm. No fibroids or other mass visualized. Endometrium Thickness: 12.6 mm which is within normal limits for patient of reproductive age. No focal abnormality visualized. Right ovary Measurements: 5.8 x 3.4 x 5.1 cm. 3.5 cm complex lesion is noted most consistent with abscess, but ovarian neoplasm cannot be excluded. Also noted is enlarged tubular and tortuous structure in right adnexal region consistent with hydrosalpinx or pyosalpinx. Left ovary Measurements: 3.0 x 2.1 x 2.0 cm. 4.6 cm complex cystic  abnormality is noted. Pulsed Doppler evaluation of both ovaries demonstrates normal low-resistance arterial and venous waveforms. Other findings No abnormal free fluid. IMPRESSION: Probable large right adnexal hydrosalpinx or pyosalpinx is noted. 3.5 cm complex lesion is noted in right ovary which most likely represents abscess, although neoplasm cannot be excluded. MRI or CT scan is recommended further evaluation. Probable 4.6 cm complex exophytic left ovarian cystic lesion is noted which may represent hemorrhagic cyst. Followup ultrasound in 6-12 weeks is recommended to ensure resolution. Electronically Signed   By: Marijo Conception, M.D.   On: 09/18/2015 14:23   Discharge Exam: Blood pressure 124/91, pulse 75, temperature 98.8 F (37.1 C), temperature source Oral, resp. rate 18, height _0  (1.575 m), weight 158 lb 3 oz (71.753 kg), last menstrual period 09/03/2015, SpO2 100 %. General appearance: alert and no distress Resp: normal breath sounds bilaterally GI: soft, non-tender; bowel sounds normal; no masses,  no organomegaly Pelvic: deferred Extremities: extremities normal, atraumatic, no cyanosis or edema and Homans sign is negative, no sign of DVT  Disposition: 01-Home or Self Care     Medication List    TAKE these medications        doxycycline 100 MG tablet  Commonly known as:  VIBRA-TABS  Take 1 tablet (100 mg total) by mouth every 12 (twelve) hours.     HYDROcodone-acetaminophen 5-325 MG tablet  Commonly known as:  NORCO/VICODIN  Take 1-2 tablets by mouth every 4 (four) hours as needed for moderate pain or severe pain.     metroNIDAZOLE 500 MG tablet  Commonly known as:  FLAGYL  Take 1 tablet (500 mg total) by mouth 2 (two) times daily.     naproxen sodium 550 MG tablet  Commonly known as:  ANAPROX DS  Take 1 tablet (550 mg total) by mouth 2 (two) times daily with a meal.        Follow-up Information    Follow up with [facility] On  09/29/2015.   Why:  9:00 am-- arrive 15 min early for appointment- be there at 8:45---Dr. Hanley Hays information:   [address] [place] [address] [phone]      Follow up with [facility] On 10/13/2015.   Specialty:  Obstetrics and Gynecology   Why:  3:45 pm for follow up. Call clinic/come to [facility] for any concerning issues   Contact information:   [place] [phone]      Signed: Osborne Oman,  MD 09/24/2015, 2:15 PM

## 2015-09-24 NOTE — Progress Notes (Signed)
Vital signs WNL. Pain controlled. Belongings accounted for. Answer patients questions. Reviewed next time medication were due. Patient accompanied by nursing staff to private vehicle.

## 2015-09-24 NOTE — Discharge Instructions (Signed)

## 2015-09-29 ENCOUNTER — Ambulatory Visit: Payer: Self-pay | Attending: Internal Medicine | Admitting: Internal Medicine

## 2015-09-29 ENCOUNTER — Encounter: Payer: Self-pay | Admitting: Internal Medicine

## 2015-09-29 VITALS — BP 128/83 | HR 95 | Temp 98.0°F | Resp 16 | Ht 62.0 in | Wt 158.0 lb

## 2015-09-29 DIAGNOSIS — Z79899 Other long term (current) drug therapy: Secondary | ICD-10-CM | POA: Insufficient documentation

## 2015-09-29 DIAGNOSIS — Z131 Encounter for screening for diabetes mellitus: Secondary | ICD-10-CM | POA: Insufficient documentation

## 2015-09-29 DIAGNOSIS — N7093 Salpingitis and oophoritis, unspecified: Secondary | ICD-10-CM | POA: Insufficient documentation

## 2015-09-29 DIAGNOSIS — L0291 Cutaneous abscess, unspecified: Secondary | ICD-10-CM | POA: Insufficient documentation

## 2015-09-29 DIAGNOSIS — Z720 Tobacco use: Secondary | ICD-10-CM

## 2015-09-29 LAB — POCT GLYCOSYLATED HEMOGLOBIN (HGB A1C)

## 2015-09-29 MED ORDER — PROMETHAZINE HCL 25 MG PO TABS
25.0000 mg | ORAL_TABLET | Freq: Three times a day (TID) | ORAL | Status: DC | PRN
Start: 2015-09-29 — End: 2016-05-21

## 2015-09-29 MED FILL — PROMETHAZINE 25 MG TABLET: 25 | 6 days supply | Qty: 20 | Fill #0

## 2015-09-29 NOTE — Patient Instructions (Signed)
-   doing well, pleasure meeting  You. -keep obgyn appt. - pick up phenergan rx for nausea

## 2015-09-29 NOTE — Progress Notes (Signed)
Patient present for hospital f/up visit for TOA.   Patient reports feeling good. She states she's having slight pain due to the start of her menses intermittent 09/26/15 rated 6/10.  Patient also reports feeling nausea and fatigue.  Patient agreed to diabetes screening, but declines flu shot.

## 2015-09-29 NOTE — Progress Notes (Signed)
Aimee Shepard, is a 35 y.o. female  ZOX:[id number]:[id number]  WJX:[id number]  DOB - Aug 14, 1980  CC:  Chief Complaint  Patient presents with  . Abscess     Fu tubal ovarian abcess  HPI: Aimee PrudeGloria Shepard is a 35 y.o. female here today to establish medical care.  Recent admitted on 3/6-09/24/15 for tubal ovarian abscess, sp 2 days of iv abx, and now on doxy and flagyl.  Feels much better. Some weakness and fatigue since admission, but this is slowly improving.   Eating well, no pain, only taking naproxen maybe 1x/day.  Not using hydrocodone.  Still taking antibiotics, has enough til her obgyn appt 10/13/15.  Denies f/c, no Cough - SOB.  Some crampng from recent menstrual started on Friday. Unable to get her nausea medicine from [facility] due to cost.  Thinks nausea related to the meds. Still smoking, Understands importance of stop smoking, but not quite ready to do so.    Outpatient meds. Current Outpatient Prescriptions on File Prior to Visit  Medication Sig Dispense Refill  . doxycycline (VIBRA-TABS) 100 MG tablet Take 1 tablet (100 mg total) by mouth every 12 (twelve) hours. 28 tablet 0  . HYDROcodone-acetaminophen (NORCO/VICODIN) 5-325 MG tablet Take 1-2 tablets by mouth every 4 (four) hours as needed for moderate pain or severe pain. 20 tablet 0  . HYDROcodone-acetaminophen (NORCO/VICODIN) 5-325 MG tablet Take 1 tablet by mouth every 6 (six) hours as needed. 30 tablet 0  . metroNIDAZOLE (FLAGYL) 500 MG tablet Take 1 tablet (500 mg total) by mouth 2 (two) times daily. 28 tablet 0  . naproxen sodium (ANAPROX DS) 550 MG tablet Take 1 tablet (550 mg total) by mouth 2 (two) times daily with a meal. 30 tablet 0   No current facility-administered medications on file prior to visit.   Family History  Problem Relation Age of Onset  . Family history unknown: Yes   Social History   Social History  . Marital Status: Single    Spouse Name: N/A  . Number of Children: N/A  . Years of Education: N/A     Occupational History  . Not on file.   Social History Main Topics  . Smoking status: Current Every Day Smoker  . Smokeless tobacco: Not on file  . Alcohol Use: No  . Drug Use: Not on file  . Sexual Activity: Yes   Other Topics Concern  . Not on file   Social History Narrative    Review of Systems: Constitutional: Negative for fever, chills, diaphoresis, activity change, appetite change and fatigue. HENT: Negative for ear pain, nosebleeds, congestion, facial swelling, rhinorrhea, neck pain, neck stiffness and ear discharge.  Eyes: Negative for pain, discharge, redness, itching and visual disturbance. Respiratory: Negative for cough, choking, chest tightness, shortness of breath, wheezing and stridor.  Cardiovascular: Negative for chest pain, palpitations and leg swelling. Gastrointestinal: Negative for abdominal distention/pain.   Genitourinary: Negative for dysuria, urgency, frequency, hematuria, flank pain, decreased urine volume, difficulty urinating and dyspareunia.   +started Menstrual period Friday. Musculoskeletal: Negative for back pain, joint swelling, arthralgia and gait problem. Neurological: Negative for dizziness, tremors, seizures, syncope, facial asymmetry, speech difficulty, weakness, light-headedness, numbness and headaches.  Hematological: Negative for adenopathy. Does not bruise/bleed easily. Psychiatric/Behavioral: Negative for hallucinations, behavioral problems, confusion, dysphoric mood, decreased concentration and agitation.    Objective:   Filed Vitals:   09/29/15 0919  BP: 128/83  Pulse: 95  Temp: 98 F (36.7 C)  Resp: 16    Physical Exam:  Constitutional: Patient appears well-developed and well-nourished. No distress. HENT: Normocephalic, atraumatic, External right and left ear normal. Oropharynx is clear and moist.  Eyes: Conjunctivae and EOM are normal. PERRL, no scleral icterus. Neck: Normal ROM. Neck supple. No JVD. No tracheal deviation.  No thyromegaly. CVS: RRR, S1/S2 +, no murmurs, no gallops, no carotid bruit.  Pulmonary: Effort and breath sounds normal, no stridor, rhonchi, wheezes, rales.  Abdominal: Soft. BS +, no distension, tenderness, rebound or guarding.  Musculoskeletal: Normal range of motion. No edema and no tenderness.  Lymphadenopathy: No lymphadenopathy noted, cervical, inguinal or axillary Neuro: Alert. Normal reflexes, muscle tone coordination. No cranial nerve deficit. Skin: Skin is warm and dry. No rash noted. Not diaphoretic. No erythema. No pallor. Psychiatric: Normal mood and affect. Behavior, judgment, thought content normal.  Lab Results  Component Value Date   WBC 9.2 09/23/2015   HGB 9.7* 09/23/2015   HCT 29.4* 09/23/2015   MCV 73.7* 09/23/2015   PLT 366 09/23/2015   Lab Results  Component Value Date   CREATININE 0.54 09/22/2015   BUN 11 09/22/2015   NA 139 09/22/2015   K 4.2 09/22/2015   CL 107 09/22/2015   CO2 26 09/22/2015    Lab Results  Component Value Date   HGBA1C 5 7 09/29/2015   Lipid Panel  No results found for: CHOL, TRIG, HDL, CHOLHDL, VLDL, LDLCALC     Assessment and plan:   1. TOA (tubo-ovarian abscess), resolving. - continue doxy/flaygl, keep obgyn appt on 3/27, nausea may be due to abx. - promethazine (PHENERGAN) 25 MG tablet; Take 1 tablet (25 mg total) by mouth every 8 (eight) hours as needed for nausea or vomiting.  Dispense: 20 tablet; Refill: 0  2. Tobacco abuse - still smoking, tobbacco cessation counseling given, not ready to stop yet.  3. Screening for diabetes mellitus (DM)  - POCT A1C 5.7   Return in about 2 months (around 11/29/2015). for annual chkup.  The patient was given clear instructions to go to ER or return to medical center if symptoms don't improve, worsen or new problems develop. The patient verbalized understanding. The patient was told to call to get lab results if they haven't heard anything in the next week.      Pete Glatter, MD, MBA/MHA [facility] [place], [place] [phone]   09/29/2015, 9:25 AM

## 2015-10-06 ENCOUNTER — Ambulatory Visit: Payer: Self-pay

## 2015-10-13 ENCOUNTER — Ambulatory Visit (INDEPENDENT_AMBULATORY_CARE_PROVIDER_SITE_OTHER): Payer: Self-pay | Admitting: Obstetrics & Gynecology

## 2015-10-13 ENCOUNTER — Encounter: Payer: Self-pay | Admitting: Obstetrics & Gynecology

## 2015-10-13 VITALS — BP 116/78 | HR 84 | Temp 98.6°F | Wt 155.6 lb

## 2015-10-13 DIAGNOSIS — N7093 Salpingitis and oophoritis, unspecified: Secondary | ICD-10-CM

## 2015-10-13 NOTE — Progress Notes (Signed)
Patient ID: Aimee Shepard, female   DOB: 09/13/1980, 35 y.o.   MRN: [id number]  Chief Complaint  Patient presents with  . New Patient (Initial Visit)    Pelvic Pain   patient was admitted for Abx for TOA  HPI Aimee Shepard is a 35 y.o. female.   Marland Kitchen  HPI  No past medical history on file.  No past surgical history on file.  Family History  Problem Relation Age of Onset  . Family history unknown: Yes    Social History Social History  Substance Use Topics  . Smoking status: Current Every Day Smoker  . Smokeless tobacco: None  . Alcohol Use: No    No Known Allergies  Current Outpatient Prescriptions  Medication Sig Dispense Refill  . doxycycline (VIBRA-TABS) 100 MG tablet Take 1 tablet (100 mg total) by mouth every 12 (twelve) hours. 28 tablet 0  . naproxen sodium (ANAPROX DS) 550 MG tablet Take 1 tablet (550 mg total) by mouth 2 (two) times daily with a meal. 30 tablet 0  . promethazine (PHENERGAN) 25 MG tablet Take 1 tablet (25 mg total) by mouth every 8 (eight) hours as needed for nausea or vomiting. 20 tablet 0  . HYDROcodone-acetaminophen (NORCO/VICODIN) 5-325 MG tablet Take 1-2 tablets by mouth every 4 (four) hours as needed for moderate pain or severe pain. (Patient not taking: Reported on 10/13/2015) 20 tablet 0  . HYDROcodone-acetaminophen (NORCO/VICODIN) 5-325 MG tablet Take 1 tablet by mouth every 6 (six) hours as needed. (Patient not taking: Reported on 10/13/2015) 30 tablet 0  . metroNIDAZOLE (FLAGYL) 500 MG tablet Take 1 tablet (500 mg total) by mouth 2 (two) times daily. (Patient not taking: Reported on 10/13/2015) 28 tablet 0   No current facility-administered medications for this visit.    Review of Systems Review of Systems  Genitourinary: Positive for menstrual problem (recent cramps). Negative for pelvic pain.    Blood pressure 116/78, pulse 84, temperature 98.6 F (37 C), weight 155 lb 9.6 oz (70.58 kg), last menstrual period 10/06/2015.  Physical  Exam Physical Exam  Constitutional: She is oriented to person, place, and time. She appears well-developed. No distress.  Abdominal: Soft.  Genitourinary: Vagina normal and uterus normal. No vaginal discharge found.  No tenderness, 4-5 cm right adnexal mass  Neurological: She is alert and oriented to person, place, and time.  Skin: Skin is warm and dry.  Psychiatric: She has a normal mood and affect. Her behavior is normal.    Data Reviewed CLINICAL DATA: Acute abdominal pain.  EXAM: TRANSABDOMINAL AND TRANSVAGINAL ULTRASOUND OF PELVIS  DOPPLER ULTRASOUND OF OVARIES  TECHNIQUE: Both transabdominal and transvaginal ultrasound examinations of the pelvis were performed. Transabdominal technique was performed for global imaging of the pelvis including uterus, ovaries, adnexal regions, and pelvic cul-de-sac.  It was necessary to proceed with endovaginal exam following the transabdominal exam to visualize the endometrium and ovaries. Color and duplex Doppler ultrasound was utilized to evaluate blood flow to the ovaries.  COMPARISON: None.  FINDINGS: Uterus  Measurements: 8.7 x 4.8 x 3.8 cm. No fibroids or other mass visualized.  Endometrium  Thickness: 12.6 mm which is within normal limits for patient of reproductive age. No focal abnormality visualized.  Right ovary  Measurements: 5.8 x 3.4 x 5.1 cm. 3.5 cm complex lesion is noted most consistent with abscess, but ovarian neoplasm cannot be excluded. Also noted is enlarged tubular and tortuous structure in right adnexal region consistent with hydrosalpinx or pyosalpinx.  Left ovary  Measurements: 3.0  x 2.1 x 2.0 cm. 4.6 cm complex cystic abnormality is noted.  Pulsed Doppler evaluation of both ovaries demonstrates normal low-resistance arterial and venous waveforms.  Other findings  No abnormal free fluid.  IMPRESSION: Probable large right adnexal hydrosalpinx or pyosalpinx is  noted.  3.5 cm complex lesion is noted in right ovary which most likely represents abscess, although neoplasm cannot be excluded. MRI or CT scan is recommended further evaluation.  Probable 4.6 cm complex exophytic left ovarian cystic lesion is noted which may represent hemorrhagic cyst. Followup ultrasound in 6-12 weeks is recommended to ensure resolution.   Electronically Signed  By: Lupita RaiderJames Green Jr, M.D.  On: 09/18/2015 14:23  Assessment    PID improved Hydrosalpinx on R     Plan    Repeat pelvic US Finish abx as prescribed Report if Sx recur        ARNOLD,JAMES 10/13/2015, 4:58 PM

## 2015-10-13 NOTE — Patient Instructions (Signed)

## 2015-10-13 NOTE — Progress Notes (Signed)
US scheduled for March 30th @ 0800.  Pt notified.

## 2015-10-16 ENCOUNTER — Ambulatory Visit (HOSPITAL_COMMUNITY): Payer: Self-pay

## 2015-10-23 ENCOUNTER — Ambulatory Visit (HOSPITAL_COMMUNITY): Payer: Self-pay

## 2016-05-08 ENCOUNTER — Emergency Department (HOSPITAL_COMMUNITY)
Admission: EM | Admit: 2016-05-08 | Discharge: 2016-05-08 | Disposition: A | Discharge status: Home / Self Care | Payer: Self-pay | Attending: Emergency Medicine | Admitting: Emergency Medicine

## 2016-05-08 ENCOUNTER — Encounter (HOSPITAL_COMMUNITY): Payer: Self-pay | Admitting: Emergency Medicine

## 2016-05-08 DIAGNOSIS — H00025 Hordeolum internum left lower eyelid: Secondary | ICD-10-CM

## 2016-05-08 DIAGNOSIS — H00015 Hordeolum externum left lower eyelid: Secondary | ICD-10-CM | POA: Insufficient documentation

## 2016-05-08 DIAGNOSIS — F17200 Nicotine dependence, unspecified, uncomplicated: Secondary | ICD-10-CM | POA: Insufficient documentation

## 2016-05-08 DIAGNOSIS — Z79899 Other long term (current) drug therapy: Secondary | ICD-10-CM | POA: Insufficient documentation

## 2016-05-08 MED ORDER — ERYTHROMYCIN 5 MG/GM OP OINT
1.0000 "application " | TOPICAL_OINTMENT | Freq: Once | OPHTHALMIC | Status: AC
  Administered 2016-05-08: 1 via OPHTHALMIC
  Filled 2016-05-08: qty 3.5

## 2016-05-08 NOTE — ED Notes (Signed)
Patient verbalized understanding of discharge instructions and denies any further needs or questions at this time. VS stable. Patient ambulatory with steady gait.  

## 2016-05-08 NOTE — ED Triage Notes (Signed)
C/o swelling and blurred vision to L eye since having eye lashes placed at a salon.  States she removed eye lashes but continues to have pain and swelling.

## 2016-05-08 NOTE — Discharge Instructions (Signed)
Apply erythromycin ointment to the left eye every 4 hours. Warm compresses. Follow-up with ophthalmology if not improving. Return if rapidly worsening.

## 2016-05-08 NOTE — ED Provider Notes (Signed)
[facility]-EMERGENCY DEPT Provider Note   CSN: [id number] Arrival date & time: 05/08/16  0017     History   Chief Complaint Chief Complaint  Patient presents with  . Eye Problem    HPI Aimee PrudeGloria Cypress is a 35 y.o. female.  HPI Aimee Shepard is a 35 y.o. female presents to emergency department complaining of swelling to the left eye. Patient states swelling started 2-3 days ago. Pain and swelling to the left lower eyelid. She states she has tried warm compresses with no relief. She reports that she used synthetic eyelashes 2 weeks ago, but had no problems up until 2 days ago. She took the eyelashes of, however her swelling and eyelid did not improve. She reports pain. Tenderness to palpation over the area. She reports intermittent purulent drainage. She denies any photophobia. She denies any fever or chills. No pain with extraocular movement. She states she is having some blurred vision or eye but believes it is because of the drainage. She denies any a other ssociated symptoms.  History reviewed. No pertinent past medical history.  Patient Active Problem List   Diagnosis Date Noted  . Tobacco abuse 09/23/2015  . TOA (tubo-ovarian abscess) 09/22/2015    History reviewed. No pertinent surgical history.  OB History    No data available       Home Medications    Prior to Admission medications   Medication Sig Start Date End Date Taking? Authorizing Provider  doxycycline (VIBRA-TABS) 100 MG tablet Take 1 tablet (100 mg total) by mouth every 12 (twelve) hours. 09/24/15   Tereso NewcomerUgonna A Anyanwu, MD  HYDROcodone-acetaminophen (NORCO/VICODIN) 5-325 MG tablet Take 1-2 tablets by mouth every 4 (four) hours as needed for moderate pain or severe pain. Patient not taking: Reported on 10/13/2015 09/18/15   Trixie DredgeEmily West, PA-C  HYDROcodone-acetaminophen (NORCO/VICODIN) 5-325 MG tablet Take 1 tablet by mouth every 6 (six) hours as needed. Patient not taking: Reported on 10/13/2015 09/24/15   Tilda BurrowJohn V Ferguson, MD    metroNIDAZOLE (FLAGYL) 500 MG tablet Take 1 tablet (500 mg total) by mouth 2 (two) times daily. Patient not taking: Reported on 10/13/2015 09/24/15   Tereso NewcomerUgonna A Anyanwu, MD  naproxen sodium (ANAPROX DS) 550 MG tablet Take 1 tablet (550 mg total) by mouth 2 (two) times daily with a meal. 09/24/15   Tereso NewcomerUgonna A Anyanwu, MD  promethazine (PHENERGAN) 25 MG tablet Take 1 tablet (25 mg total) by mouth every 8 (eight) hours as needed for nausea or vomiting. 09/29/15   Pete Glatterawn T Langeland, MD    Family History Family History  Problem Relation Age of Onset  . Family history unknown: Yes    Social History Social History  Substance Use Topics  . Smoking status: Current Every Day Smoker  . Smokeless tobacco: Never Used  . Alcohol use No     Allergies   Review of patient's allergies indicates no known allergies.   Review of Systems Review of Systems  Constitutional: Negative for chills and fever.  Eyes: Positive for pain, discharge, redness and visual disturbance. Negative for photophobia and itching.  Skin: Positive for color change.  Neurological: Negative for dizziness, light-headedness and headaches.  All other systems reviewed and are negative.    Physical Exam Updated Vital Signs BP 128/72 (BP Location: Left Arm)   Pulse 102   Temp 98 F (36.7 C) (Oral)   Resp 16   Ht 5\' 2"  (1.575 m)   Wt 72.6 kg   LMP 04/17/2016   SpO2 100%  BMI 29.26 kg/m   Physical Exam  Constitutional: She appears well-developed and well-nourished. No distress.  Eyes: Conjunctivae and EOM are normal. Pupils are equal, round, and reactive to light.  Erythema to the left lower eyelid with some induration over the lateral lead. Normal conjunctiva. Normal upper lid. No drainage. Exam consistent with internal hordeolum of the lower lid of the left eye. No pain with extraocular movement.  Neck: Neck supple.  Cardiovascular: Normal rate.   Pulmonary/Chest: Effort normal.  Neurological: She is alert.  Skin: Skin is  warm and dry.  Nursing note and vitals reviewed.    ED Treatments / Results  Labs (all labs ordered are listed, but only abnormal results are displayed) Labs Reviewed - No data to display  EKG  EKG Interpretation None       Radiology No results found.  Procedures Procedures (including critical care time)  Medications Ordered in ED Medications  erythromycin ophthalmic ointment 1 application (not administered)     Initial Impression / Assessment and Plan / ED Course  I have reviewed the triage vital signs and the nursing notes.  Pertinent labs & imaging results that were available during my care of the patient were reviewed by me and considered in my medical decision making (see chart for details).  Clinical Course   Patient in emergency department with pain and swelling to the left lower lid. Exam is most consistent with internal hordeolum of the lower lid. Will give erythromycin ointment. Continue warm compresses. Follow-up with ophthalmology. Return precautions discussed. No pain with extraocular movements, doubt orbital cellulitis at this time.  Afebrile. Non toxic appearing.   Vitals:   05/08/16 0022 05/08/16 0023  BP: 128/72   Pulse: 102   Resp: 16   Temp: 98 F (36.7 C)   TempSrc: Oral   SpO2: 100%   Weight:  72.6 kg  Height:  5\' 2"  (1.575 m)    Final Clinical Impressions(s) / ED Diagnoses   Final diagnoses:  Hordeolum internum of left lower eyelid    New Prescriptions New Prescriptions   No medications on file     Jaynie Crumble, PA-C 05/08/16 0056    Layla Maw Ward, DO 05/08/16 0454

## 2016-05-19 ENCOUNTER — Inpatient Hospital Stay (HOSPITAL_COMMUNITY)
Admission: AD | Admit: 2016-05-19 | Discharge: 2016-05-19 | Discharge status: Against Medical Advice | Payer: Self-pay | Source: Ambulatory Visit | Attending: Obstetrics & Gynecology | Admitting: Obstetrics & Gynecology

## 2016-05-19 DIAGNOSIS — R109 Unspecified abdominal pain: Secondary | ICD-10-CM | POA: Insufficient documentation

## 2016-05-19 DIAGNOSIS — O26899 Other specified pregnancy related conditions, unspecified trimester: Secondary | ICD-10-CM | POA: Insufficient documentation

## 2016-05-19 LAB — POCT PREGNANCY, URINE: PREG TEST UR: NEGATIVE

## 2016-05-19 LAB — URINALYSIS, ROUTINE W REFLEX MICROSCOPIC
Bilirubin Urine: NEGATIVE
Glucose, UA: NEGATIVE mg/dL
HGB URINE DIPSTICK: NEGATIVE
Ketones, ur: NEGATIVE mg/dL
Leukocytes, UA: NEGATIVE
NITRITE: NEGATIVE
PROTEIN: NEGATIVE mg/dL
Specific Gravity, Urine: 1.015 (ref 1.005–1.030)
pH: 7 (ref 5.0–8.0)

## 2016-05-19 NOTE — MAU Note (Signed)
Pt presents to MAU with complaints of lower back and abdominal pain for 2 or 3 days. Pt was evaluated at [place] a week ago.

## 2016-05-19 NOTE — MAU Note (Signed)
Not in lobby

## 2016-05-21 ENCOUNTER — Encounter (HOSPITAL_COMMUNITY): Payer: Self-pay

## 2016-05-21 ENCOUNTER — Emergency Department (HOSPITAL_COMMUNITY): Payer: Self-pay

## 2016-05-21 ENCOUNTER — Inpatient Hospital Stay (HOSPITAL_COMMUNITY)
Admission: EM | Admit: 2016-05-21 | Discharge: 2016-05-22 | Disposition: A | Discharge status: Home / Self Care | Payer: Self-pay | Attending: Obstetrics and Gynecology | Admitting: Obstetrics and Gynecology

## 2016-05-21 DIAGNOSIS — R102 Pelvic and perineal pain: Secondary | ICD-10-CM

## 2016-05-21 DIAGNOSIS — N7093 Salpingitis and oophoritis, unspecified: Secondary | ICD-10-CM

## 2016-05-21 DIAGNOSIS — R1030 Lower abdominal pain, unspecified: Secondary | ICD-10-CM

## 2016-05-21 DIAGNOSIS — F17200 Nicotine dependence, unspecified, uncomplicated: Secondary | ICD-10-CM | POA: Insufficient documentation

## 2016-05-21 DIAGNOSIS — R1031 Right lower quadrant pain: Secondary | ICD-10-CM | POA: Insufficient documentation

## 2016-05-21 DIAGNOSIS — N7011 Chronic salpingitis: Secondary | ICD-10-CM | POA: Insufficient documentation

## 2016-05-21 HISTORY — DX: Personal history of other infectious and parasitic diseases: Z86.19

## 2016-05-21 HISTORY — DX: Other specified conditions associated with female genital organs and menstrual cycle: N94.89

## 2016-05-21 LAB — URINALYSIS, ROUTINE W REFLEX MICROSCOPIC
Bilirubin Urine: NEGATIVE
GLUCOSE, UA: NEGATIVE mg/dL
HGB URINE DIPSTICK: NEGATIVE
Ketones, ur: NEGATIVE mg/dL
LEUKOCYTES UA: NEGATIVE
Nitrite: NEGATIVE
PROTEIN: NEGATIVE mg/dL
SPECIFIC GRAVITY, URINE: 1.014 (ref 1.005–1.030)
pH: 7 (ref 5.0–8.0)

## 2016-05-21 LAB — COMPREHENSIVE METABOLIC PANEL
ALBUMIN: 4.1 g/dL (ref 3.5–5.0)
ALT: 16 U/L (ref 14–54)
ANION GAP: 9 (ref 5–15)
AST: 32 U/L (ref 15–41)
Alkaline Phosphatase: 54 U/L (ref 38–126)
BUN: 9 mg/dL (ref 6–20)
CHLORIDE: 106 mmol/L (ref 101–111)
CO2: 24 mmol/L (ref 22–32)
Calcium: 9.4 mg/dL (ref 8.9–10.3)
Creatinine, Ser: 0.64 mg/dL (ref 0.44–1.00)
GFR calc Af Amer: >60 mL/min (ref >60)
GFR calc non Af Amer: >60 mL/min (ref >60)
GLUCOSE: 97 mg/dL (ref 65–99)
POTASSIUM: 4.3 mmol/L (ref 3.5–5.1)
SODIUM: 139 mmol/L (ref 135–145)
Total Bilirubin: 0.8 mg/dL (ref 0.3–1.2)
Total Protein: 7.4 g/dL (ref 6.5–8.1)

## 2016-05-21 LAB — CBC
HCT: 33.2 % — ABNORMAL LOW (ref 36.0–46.0)
Hemoglobin: 10.9 g/dL — ABNORMAL LOW (ref 12.0–15.0)
MCH: 25.5 pg — ABNORMAL LOW (ref 26.0–34.0)
MCHC: 32.8 g/dL (ref 30.0–36.0)
MCV: 77.6 fL — AB (ref 78.0–100.0)
PLATELETS: 347 10*3/uL (ref 150–400)
RBC: 4.28 MIL/uL (ref 3.87–5.11)
RDW: 17.3 % — AB (ref 11.5–15.5)
WBC: 6.9 10*3/uL (ref 4.0–10.5)

## 2016-05-21 LAB — LIPASE, BLOOD: LIPASE: 38 U/L (ref 11–51)

## 2016-05-21 LAB — POC URINE PREG, ED: PREG TEST UR: NEGATIVE

## 2016-05-21 MED ORDER — DEXTROSE 5 % IV SOLN
2.0000 g | Freq: Once | INTRAVENOUS | Status: AC
  Administered 2016-05-22: 2 g via INTRAVENOUS
  Filled 2016-05-21: qty 2

## 2016-05-21 MED ORDER — IOPAMIDOL (ISOVUE-300) INJECTION 61%
30.0000 mL | Freq: Once | INTRAVENOUS | Status: AC | PRN
Start: 2016-05-21 — End: 2016-05-21
  Administered 2016-05-21: 15 mL via ORAL

## 2016-05-21 MED ORDER — IOPAMIDOL (ISOVUE-300) INJECTION 61%
100.0000 mL | Freq: Once | INTRAVENOUS | Status: AC | PRN
  Administered 2016-05-21: 100 mL via INTRAVENOUS

## 2016-05-21 MED ORDER — METRONIDAZOLE IN NACL 5-0.79 MG/ML-% IV SOLN
500.0000 mg | Freq: Once | INTRAVENOUS | Status: AC
  Administered 2016-05-22: 500 mg via INTRAVENOUS
  Filled 2016-05-21: qty 100

## 2016-05-21 MED ORDER — HYDROMORPHONE HCL 1 MG/ML IJ SOLN
0.5000 mg | Freq: Once | INTRAMUSCULAR | Status: AC
  Administered 2016-05-21: 0.5 mg via INTRAVENOUS
  Filled 2016-05-21: qty 1

## 2016-05-21 NOTE — ED Provider Notes (Signed)
35 year old female comes in with a four-day history of right lower quadrant pain which is worsening. Also pain across her lower back. [facility] and had ultrasound which showed a right adnexal lesion adjacent to the right ovary and a dilated tubular structure adjacent to this lesion. On exam, she has rather prominent right lower quadrant tenderness with plus/minus rebound tenderness. Back is tender across the lower lumbar area. Given progression of symptoms, I feel she should she be sent for CT to rule out appendicitis.  CT is worrisome for tubo-ovarian abscess. Case has [facility] to [facility] the patient in transfer. She started on antibiotics.  Medical screening examination/treatment/procedure(s) were conducted as a shared visit with non-physician practitioner(s) and myself.  I personally evaluated the patient during the encounter.     Dione Boozeavid Kalise Fickett, MD 05/22/16 [phone]

## 2016-05-21 NOTE — ED Provider Notes (Signed)
[facility]-EMERGENCY DEPT Provider Note  CSN: [id number] Arrival date & time: 05/21/16  2041 By signing my name below, I, Aimee Shepard, attest that this documentation has been prepared under the direction and in the presence of Aimee AnisShari Glennette Galster, PA-C. Electronically Signed: Linus GalasMaharshi Shepard, ED Scribe. 05/21/16. 9:35 PM.  History   Chief Complaint Chief Complaint  Patient presents with  . Back Pain  . Abdominal Pain   The history is provided by the patient. No language interpreter was used.   HPI Comments: Aimee Shepard is a 35 y.o. female who presents to the Emergency Department complaining of lower back pain that began 3 days ago. Pt also reports associated right lower abdominal pain. Her pain is aggravated with movement and alleviated at rest. Pt denies any previous back injuries. Pt denies any N/V/D or any other symptoms at this time. Pts LNMP 04/27/16. She last had a normal bowel movement today.   [facility] for the cyst on her ovary.   History reviewed. No pertinent past medical history.  Patient Active Problem List   Diagnosis Date Noted  . Tobacco abuse 09/23/2015  . TOA (tubo-ovarian abscess) 09/22/2015   History reviewed. No pertinent surgical history.  OB History    No data available     Home Medications    Prior to Admission medications   Medication Sig Start Date End Date Taking? Authorizing Provider  doxycycline (VIBRA-TABS) 100 MG tablet Take 1 tablet (100 mg total) by mouth every 12 (twelve) hours. 09/24/15   Tereso NewcomerUgonna A Anyanwu, MD  HYDROcodone-acetaminophen (NORCO/VICODIN) 5-325 MG tablet Take 1-2 tablets by mouth every 4 (four) hours as needed for moderate pain or severe pain. Patient not taking: Reported on 10/13/2015 09/18/15   Trixie DredgeEmily West, PA-C  HYDROcodone-acetaminophen (NORCO/VICODIN) 5-325 MG tablet Take 1 tablet by mouth every 6 (six) hours as needed. Patient not taking: Reported on 10/13/2015 09/24/15   Aimee BurrowJohn V Ferguson, MD  metroNIDAZOLE (FLAGYL) 500  MG tablet Take 1 tablet (500 mg total) by mouth 2 (two) times daily. Patient not taking: Reported on 10/13/2015 09/24/15   Tereso NewcomerUgonna A Anyanwu, MD  naproxen sodium (ANAPROX DS) 550 MG tablet Take 1 tablet (550 mg total) by mouth 2 (two) times daily with a meal. 09/24/15   Tereso NewcomerUgonna A Anyanwu, MD  promethazine (PHENERGAN) 25 MG tablet Take 1 tablet (25 mg total) by mouth every 8 (eight) hours as needed for nausea or vomiting. 09/29/15   Aimee Glatterawn T Langeland, MD   Family History Family History  Problem Relation Age of Onset  . Family history unknown: Yes   Social History Social History  Substance Use Topics  . Smoking status: Current Every Day Smoker  . Smokeless tobacco: Never Used  . Alcohol use No   Allergies   Review of patient's allergies indicates no known allergies.  Review of Systems Review of Systems  Gastrointestinal: Positive for abdominal pain. Negative for diarrhea, nausea and vomiting.  Musculoskeletal: Positive for back pain.    Physical Exam Updated Vital Signs BP 135/91 (BP Location: Left Arm)   Pulse 88   Temp 98.1 F (36.7 C) (Oral)   Resp 16   Ht 5\' 2"  (1.575 m)   Wt 164 lb 8 oz (74.6 kg)   LMP 05/02/2016 (Approximate)   SpO2 98%   BMI 30.09 kg/m   Physical Exam  Constitutional: She is oriented to person, place, and time. She appears well-developed and well-nourished.  HENT:  Head: Normocephalic.  Eyes: Conjunctivae are normal.  Cardiovascular: Normal rate.   Pulmonary/Chest: Effort normal.  Abdominal: There is no rebound and no guarding.  RLQ tenderness  Musculoskeletal:  Lower back has no swelling or reproducible tenderness. No CVA tenderness.  Neurological: She is alert and oriented to person, place, and time.  Skin: Skin is warm and dry.  Psychiatric: She has a normal mood and affect.  Nursing note and vitals reviewed.  ED Treatments / Results  DIAGNOSTIC STUDIES: Oxygen Saturation is 98% on room air, normal by my interpretation.    COORDINATION OF  CARE: 9:35 PM Discussed treatment plan with pt at bedside and pt agreed to plan.  Labs (all labs ordered are listed, but only abnormal results are displayed) Labs Reviewed  URINALYSIS, ROUTINE W REFLEX MICROSCOPIC (NOT AT [facility]) - Abnormal; Notable for the following:       Result Value   APPearance CLOUDY (*)    All other components within normal limits  LIPASE, BLOOD  COMPREHENSIVE METABOLIC PANEL  CBC  POC URINE PREG, ED   EKG  EKG Interpretation None      Radiology No results found.  Procedures Procedures (including critical care time)  Medications Ordered in ED Medications - No data to display  Initial Impression / Assessment and Plan / ED Course  I have reviewed the triage vital signs and the nursing notes.  Pertinent labs & imaging results that were available during my care of the patient were reviewed by me and considered in my medical decision making (see chart for details).  Clinical Course   Patient with low back and RLQ abdominal pain, evaluated by CT scan to r/o appendicitis. She has a normal appendix but CT showing questionable pyosalpinx. She has had a previous TOA and there is concern for recurrence. Discussed with Dr. Emelda FearFerguson (GYN) who [facility].  Final Clinical Impressions(s) / ED Diagnoses   Final diagnoses:  None   1. Pyosalpinx  New Prescriptions New Prescriptions   No medications on file  I personally performed the services described in this documentation, which was scribed in my presence. The recorded information has been reviewed and is accurate.      Aimee AnisShari Brendan Gadson, PA-C 06/07/16 [phone]    Aimee Boozeavid Glick, MD 06/07/16 [phone]

## 2016-05-21 NOTE — ED Triage Notes (Signed)
Patient states that she has had "excruciating back pain" x 3 days.  Patient denies trauma to her back.  Patient also c/o RLQ abd pain that also began x 3days ago.  Patient states that she had a cyst on her ovary before on the same right side.  Patient denies painful urination or blood in the urine.

## 2016-05-21 NOTE — ED Notes (Signed)
Went to give patient medication and she is in CT.

## 2016-05-22 ENCOUNTER — Inpatient Hospital Stay (HOSPITAL_COMMUNITY): Payer: Self-pay

## 2016-05-22 ENCOUNTER — Encounter (HOSPITAL_COMMUNITY): Payer: Self-pay | Admitting: *Deleted

## 2016-05-22 LAB — CBC WITH DIFFERENTIAL/PLATELET
BASOS ABS: 0 10*3/uL (ref 0.0–0.1)
Basophils Relative: 1 %
Eosinophils Absolute: 0.1 10*3/uL (ref 0.0–0.7)
Eosinophils Relative: 2 %
HEMATOCRIT: 33.4 % — AB (ref 36.0–46.0)
HEMOGLOBIN: 11.3 g/dL — AB (ref 12.0–15.0)
LYMPHS PCT: 45 %
Lymphs Abs: 2.6 10*3/uL (ref 0.7–4.0)
MCH: 25.9 pg — ABNORMAL LOW (ref 26.0–34.0)
MCHC: 33.8 g/dL (ref 30.0–36.0)
MCV: 76.4 fL — AB (ref 78.0–100.0)
MONO ABS: 0.4 10*3/uL (ref 0.1–1.0)
Monocytes Relative: 7 %
NEUTROS ABS: 2.7 10*3/uL (ref 1.7–7.7)
Neutrophils Relative %: 45 %
Platelets: 297 10*3/uL (ref 150–400)
RBC: 4.37 MIL/uL (ref 3.87–5.11)
RDW: 17.3 % — AB (ref 11.5–15.5)
WBC: 5.9 10*3/uL (ref 4.0–10.5)

## 2016-05-22 LAB — RAPID URINE DRUG SCREEN, HOSP PERFORMED
Amphetamines: NOT DETECTED
BENZODIAZEPINES: NOT DETECTED
Barbiturates: NOT DETECTED
COCAINE: NOT DETECTED
OPIATES: POSITIVE — AB
Tetrahydrocannabinol: POSITIVE — AB

## 2016-05-22 LAB — TYPE AND SCREEN
ABO/RH(D): O POS
ANTIBODY SCREEN: NEGATIVE

## 2016-05-22 LAB — SEDIMENTATION RATE: SED RATE: 9 mm/h (ref 0–22)

## 2016-05-22 LAB — ABO/RH: ABO/RH(D): O POS

## 2016-05-22 MED ORDER — KETOROLAC TROMETHAMINE 10 MG PO TABS: 30.0000 mg | ORAL_TABLET | Freq: Every day | ORAL | 0 refills | Status: AC

## 2016-05-22 MED ORDER — HYDROMORPHONE HCL 1 MG/ML IJ SOLN
1.0000 mg | Freq: Once | INTRAMUSCULAR | Status: AC
  Administered 2016-05-22: 1 mg via INTRAVENOUS
  Filled 2016-05-22: qty 1

## 2016-05-22 MED ORDER — OXYCODONE-ACETAMINOPHEN 5-325 MG PO TABS: 1.0000 | ORAL_TABLET | Freq: Four times a day (QID) | ORAL | 0 refills | Status: AC | PRN

## 2016-05-22 MED ORDER — KETOROLAC TROMETHAMINE 30 MG/ML IJ SOLN
30.0000 mg | Freq: Once | INTRAMUSCULAR | Status: AC
  Administered 2016-05-22: 30 mg via INTRAVENOUS
  Filled 2016-05-22: qty 1

## 2016-05-22 MED ORDER — CYCLOBENZAPRINE HCL 10 MG PO TABS
10.0000 mg | ORAL_TABLET | Freq: Three times a day (TID) | ORAL | Status: DC | PRN
  Administered 2016-05-22: 10 mg via ORAL
  Filled 2016-05-22: qty 1

## 2016-05-22 NOTE — MAU Provider Note (Signed)
Chief Complaint: Back Pain and Abdominal Pain   First Provider Initiated Contact with Patient 05/22/16 0716     SUBJECTIVE HPI: Aimee Shepard is a 35 y.o. non-pregnant female who was transferred to [facility] from [facility] ED for RLQ pain x 3 days thought to be TOA. Was also seen at [facility] 05/20/16 for same problem. However, pt is afebrile and has Nml WBC of 6. Was Dx'd w/ TOA in March 3/17, but states this feel completely different and that pain is predominately in her low back this time.  [place]  At [facility] showed:  There is a 4.1 x 2.8 x 2.8 cm oval adnexal lesion adjacent to but not involving the right ovary, which has mostly homogenous contents which are isoechoic to ovarian parenchyma and a with a fluid-fluid level. No internal vascularity or peripheral hypervascularity is identified in association with this lesion. There is a dilated tubular structure seen immediately adjacent to this lesion   GC/Chlamydia neg. Wet prep pos BV.    CT at [facility] showed: Bilateral hydrosalpinx. On the right, there is significant mural enhancement of the hydrosalpinx, which measures up to 3.9 cm in cross-section. Cannot exclude pyosalpinx.  Received Dilaudid 1 mg during transfer which only reduced pain to 7/10.  Location: low back Quality: sharp Severity: 7/10 on pain scale Duration: 3 days Context: none. Denies recent injury, change in physical activity or Hx similar Sx.  Timing: Constant w/ Intermittent exacerbations Modifying factors: Worse w/ mvmt. Associated signs and symptoms: Neg for fever, chills, N/V/D/C, urinary complaints, difficulties w/ gait.   Past Medical History:  Diagnosis Date  . Medical history non-contributory    OB History  No data available   Past Surgical History:  Procedure Laterality Date  . NO PAST SURGERIES     Social History   Social History  . Marital status: Single    Spouse name: N/A  . Number of children: N/A  . Years of education: N/A    Occupational History  . Not on file.   Social History Main Topics  . Smoking status: Current Every Day Smoker  . Smokeless tobacco: Never Used  . Alcohol use No  . Drug use:     Types: Marijuana     Comment: last use 2 weeks ago (May 07, 2016)  . Sexual activity: No     Comment: 3 months ago last intercourse   Other Topics Concern  . Not on file   Social History Narrative  . No narrative on file   Family History  Problem Relation Age of Onset  . Family history unknown: Yes   No current facility-administered medications on file prior to encounter.    No current outpatient prescriptions on file prior to encounter.   No Known Allergies  I have reviewed patient's Past Medical Hx, Surgical Hx, Family Hx, Social Hx, medications and allergies.   Review of Systems  Constitutional: Negative for appetite change, chills and fever.  Gastrointestinal: Positive for abdominal pain. Negative for constipation, diarrhea, nausea and vomiting.  Genitourinary: Negative for dysuria, frequency, hematuria, urgency, vaginal bleeding and vaginal discharge.  Musculoskeletal: Positive for back pain. Negative for gait problem.    OBJECTIVE Patient Vitals for the past 24 hrs:  BP Temp Temp src Pulse Resp SpO2 Height Weight  05/22/16 0652 102/70 - - 73 16 - - -  05/22/16 0230 103/69 - - 75 - 94 % - -  05/22/16 0200 95/75 - - 74 - 96 % - -  05/22/16  0130 105/67 - - 77 - 94 % - -  05/22/16 0100 116/80 - - 64 - 97 % - -  05/22/16 0030 106/77 98.5 F (36.9 C) Oral 72 16 94 % - -  05/22/16 0000 106/72 - - 70 - 95 % - -  05/21/16 2300 120/82 - - 78 - 97 % - -  05/21/16 2230 115/69 - - 85 - 94 % - -  05/21/16 2158 110/70 - - 79 16 98 % - -  05/21/16 2042 135/91 98.1 F (36.7 C) Oral 88 16 98 % 5\' 2"  (1.575 m) 164 lb 8 oz (74.6 kg)   Constitutional: Well-developed, well-nourished female in mild-mod distress.  Cardiovascular: normal rate Respiratory: normal rate and effort.  GI: Abd soft,  non-tender. MS: Low back TTP, normal ROM Neurologic: Alert and oriented x 4.  GU: Neg CVAT.  SPECULUM EXAM: Def[facility]  LAB RESULTS Results for orders placed or performed during the hospital encounter of 05/21/16 (from the past 24 hour(s))  Urinalysis, Routine w reflex microscopic     Status: Abnormal   Collection Time: 05/21/16  9:04 PM  Result Value Ref Range   Color, Urine YELLOW YELLOW   APPearance CLOUDY (A) CLEAR   Specific Gravity, Urine 1.014 1.005 - 1.030   pH 7.0 5.0 - 8.0   Glucose, UA NEGATIVE NEGATIVE mg/dL   Hgb urine dipstick NEGATIVE NEGATIVE   Bilirubin Urine NEGATIVE NEGATIVE   Ketones, ur NEGATIVE NEGATIVE mg/dL   Protein, ur NEGATIVE NEGATIVE mg/dL   Nitrite NEGATIVE NEGATIVE   Leukocytes, UA NEGATIVE NEGATIVE  POC urine preg, ED     Status: None   Collection Time: 05/21/16  9:15 PM  Result Value Ref Range   Preg Test, Ur NEGATIVE NEGATIVE  Lipase, blood     Status: None   Collection Time: 05/21/16  9:23 PM  Result Value Ref Range   Lipase 38 11 - 51 U/L  Comprehensive metabolic panel     Status: None   Collection Time: 05/21/16  9:23 PM  Result Value Ref Range   Sodium 139 135 - 145 mmol/L   Potassium 4.3 3.5 - 5.1 mmol/L   Chloride 106 101 - 111 mmol/L   CO2 24 22 - 32 mmol/L   Glucose, Bld 97 65 - 99 mg/dL   BUN 9 6 - 20 mg/dL   Creatinine, Ser 3.080.64 0.44 - 1.00 mg/dL   Calcium 9.4 8.9 - 65.710.3 mg/dL   Total Protein 7.4 6.5 - 8.1 g/dL   Albumin 4.1 3.5 - 5.0 g/dL   AST 32 15 - 41 U/L   ALT 16 14 - 54 U/L   Alkaline Phosphatase 54 38 - 126 U/L   Total Bilirubin 0.8 0.3 - 1.2 mg/dL   GFR calc non Af Amer >60 >60 mL/min   GFR calc Af Amer >60 >60 mL/min   Anion gap 9 5 - 15  CBC     Status: Abnormal   Collection Time: 05/21/16 10:00 PM  Result Value Ref Range   WBC 6.9 4.0 - 10.5 K/uL   RBC 4.28 3.87 - 5.11 MIL/uL   Hemoglobin 10.9 (L) 12.0 - 15.0 g/dL   HCT 84.633.2 (L) 96.236.0 - 95.246.0 %   MCV 77.6 (L) 78.0 - 100.0 fL   MCH  25.5 (L) 26.0 - 34.0 pg   MCHC 32.8 30.0 - 36.0 g/dL   RDW 84.117.3 (H) 32.411.5 - 40.115.5 %   Platelets 347 150 -  400 K/uL    IMAGING Ct Abdomen Pelvis W Contrast  Result Date: 05/21/2016 CLINICAL DATA:  Right lower quadrant pain EXAM: CT ABDOMEN AND PELVIS WITH CONTRAST TECHNIQUE: Multidetector CT imaging of the abdomen and pelvis was performed using the standard protocol following bolus administration of intravenous contrast. CONTRAST:  15mL ISOVUE-300 IOPAMIDOL (ISOVUE-300) INJECTION 61%, 100mL ISOVUE-300 IOPAMIDOL (ISOVUE-300) INJECTION 61% COMPARISON:  Ultrasound 09/18/2015 FINDINGS: Lower chest: No acute abnormality. Hepatobiliary: No focal liver abnormality is seen. No gallstones, gallbladder wall thickening, or biliary dilatation. Pancreas: Unremarkable. No pancreatic ductal dilatation or surrounding inflammatory changes. Spleen: Normal in size without focal abnormality. Adrenals/Urinary Tract: Adrenal glands are unremarkable. Kidneys are normal, without renal calculi, focal lesion, or hydronephrosis. Bladder is unremarkable. Stomach/Bowel: Stomach is within normal limits. Appendix appears normal. Colon is normal. No evidence of bowel wall thickening, distention, or inflammatory changes. Vascular/Lymphatic: Minimal atherosclerotic calcification of the distal abdominal aorta. No other significant vascular findings are present. No enlarged abdominal or pelvic lymph nodes. Reproductive: Bilateral hydrosalpinx. On the right, there is significant mural enhancement of the hydrosalpinx, which measures up to 3.9 cm in cross-section. Cannot exclude pyosalpinx. Uterus is unremarkable. Other: No ascites. Small fat containing umbilical hernia incidentally noted. Musculoskeletal: No acute or significant osseous findings. IMPRESSION: 1. Normal appendix 2. Bilateral hydrosalpinx, larger on the right where there also is mural enhancement raising the question of pyosalpinx. Electronically Signed   By: Ellery Plunkaniel R Mitchell M.D.    On: 05/21/2016 23:38    MAU COURSE Orders Placed This Encounter  Procedures  . CT Abdomen Pelvis W Contrast  . Lipase, blood  . Comprehensive metabolic panel  . Urinalysis, Routine w reflex microscopic  . CBC  . Sedimentation rate  . CBC with Differential/Platelet  . Diet NPO time specified  . Saline Lock IV, Maintain IV access  . POC urine preg, ED   Meds ordered this encounter  Medications  . HYDROmorphone (DILAUDID) injection 0.5 mg  . iopamidol (ISOVUE-300) 61 % injection 30 mL  . iopamidol (ISOVUE-300) 61 % injection 100 mL  . cefOXitin (MEFOXIN) 2 g in dextrose 5 % 50 mL IVPB    Order Specific Question:   Antibiotic Indication:    Answer:   Pelvic Inflammatory Disease  . metroNIDAZOLE (FLAGYL) IVPB 500 mg    Order Specific Question:   Antibiotic Indication:    Answer:   Pelvic Inflammatory Disease  . cyclobenzaprine (FLEXERIL) tablet 10 mg  . ketorolac (TORADOL) 30 MG/ML injection 30 mg    MDM/Assessment - Suspect that pain may be musculoskeletal and that US/CT finding are chronic and not TOA.  Plan: - Dr. Emelda FearFerguson notified of patient arrival, Hx, exam. Will come see pt.  [place] Rebecah Dangerfield, CNM 05/22/2016  7:49 AM

## 2016-05-22 NOTE — Discharge Instructions (Signed)

## 2016-05-22 NOTE — ED Notes (Signed)
[facility] called to receive patient.

## 2016-05-22 NOTE — MAU Provider Note (Signed)
  Patient confirms G0 LMP 10/15 and has regular, qmonth periods that are somewhat heavy and painful; no intermenstrual bleeding. Remote h/o STDs. Patient unsure of when last pap smear was but thinks hers has been normal. Not on anything for contraception. Pt states she came into for evaluation for 4-5d worsening low back pain (no abdominal pain) that feels sharp, non radiating, in low middle back and is constant and is stable to slightly worsening from onset. She states it doesn't feel similar to 09/2015 admission RLQ pain.  No dysuria, hematuria, VB, or discharge, nausea, vomiting, chest pain, SOB, fevers, chills.   She received a dose of cefoxitin in the ER and has received dilaudid IV 1.5mg  total and toradol 30mg  x 1.   D/w her re: unchanged u/s results and similar to 09/2015 results here and [facility]. No e/o torsion and no FF on u/s and confirmatory findings on ER CT A/P with normal appearing appendix. No leukocytosis and patient with normal and stable VS and non concerning PE and labs (see below).  Based on this, I told her that she needs some surgical procedure in the near future to likely take care of this, and it would either involve possible IR drainage procedure with risk of recurrence with that or surgery with likely b/l salpingectomy which would sterilize her and means she'd need IVF for any pregnancies. She states she doesn't have a desire for childbearing. I told her that a long term plan needs to be finalized but she doesn't appear to have anything needing inpatient admission like infection and she states her pain is better and is willing to try outpatient mangement.  She states she has a [facility] follow up appointment on 11/10 and she was told to keep that appointment and ER precautions were given to her  CBC Latest Ref Rng & Units 05/22/2016 05/21/2016 09/23/2015  WBC 4.0 - 10.5 K/uL 5.9 6.9 9.2  Hemoglobin 12.0 - 15.0 g/dL 11.3(L) 10.9(L) 9.7(L)  Hematocrit 36.0 - 46.0 % 33.4(L)  33.2(L) 29.4(L)  Platelets 150 - 400 K/uL 297 347 366   UDS: +THC and opiates (patient received some here and in the ER) Negative: UPT, u/a (just "cloudy") Pending: UCx, sed rate  NAD, resting comfortably Abdomen: NTTP, ND Back: NTTP in mid low back in area where patient states she has the sharp pains, no CVAT  Will d/c her homeon PO toradol course and PRN oxycodone  Aimee Shepard, Jr MD Attending Center [facility](Faculty Practice)

## 2016-05-23 LAB — URINE CULTURE

## 2016-12-02 ENCOUNTER — Encounter: Payer: Self-pay | Admitting: Internal Medicine

## 2017-07-15 IMAGING — CT CT ABD-PELV W/ CM
2 of 4 series · 16 of 46 positions shown, 18 images · IV contrast (iopamidol)
Comparison: Ultrasound 09/18/2015

CLINICAL DATA: Right lower quadrant pain

EXAM:
CT ABDOMEN AND PELVIS WITH CONTRAST
TECHNIQUE: Multidetector CT imaging of the abdomen and pelvis was performed
using the standard protocol following bolus administration of
intravenous contrast.
CONTRAST:  15mL [id number] IOPAMIDOL ([id number]) INJECTION 61%,
100mL [id number] IOPAMIDOL ([id number]) INJECTION 61%

[Series 2: abd/pel with · axial · 0.74mm/px · z∈[-410,-30]mm · 13 of 86 slices shown, 15 images]
[im 5/86  soft-tissue]
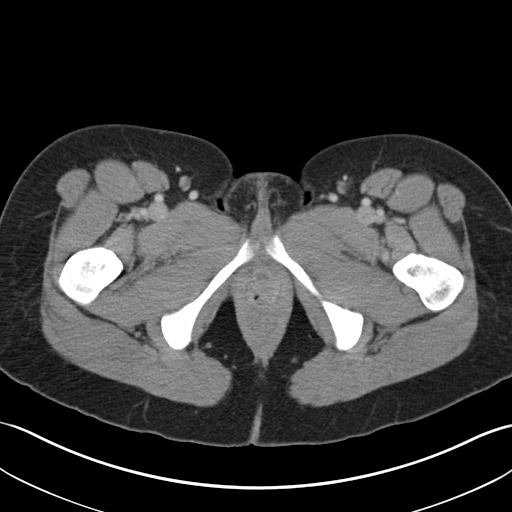
[im 5/86  bone]
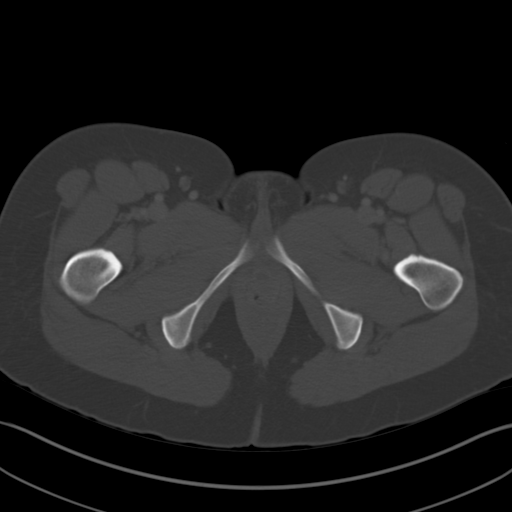
[im 10/86  soft-tissue]
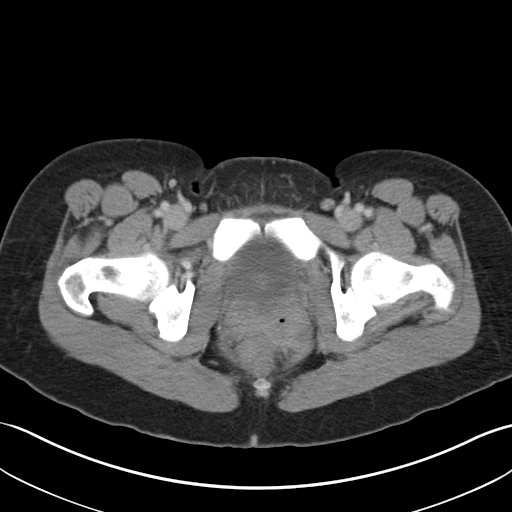
[im 19/86  soft-tissue]
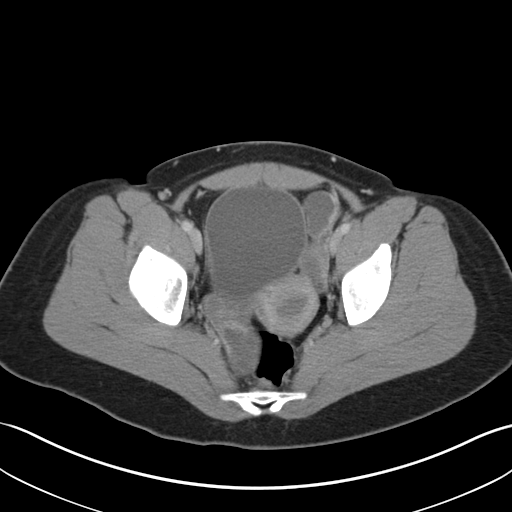
[im 24/86  soft-tissue]
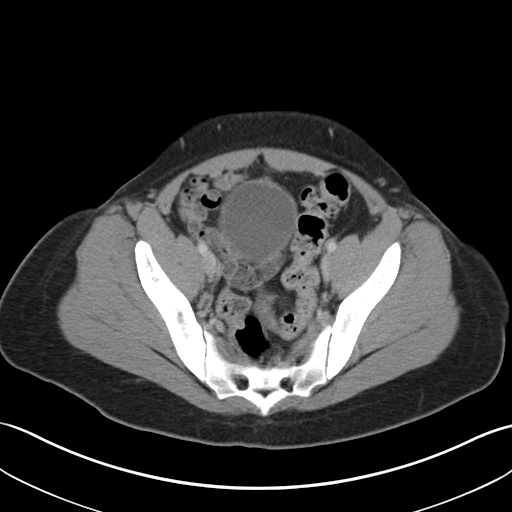
[im 29/86  soft-tissue]
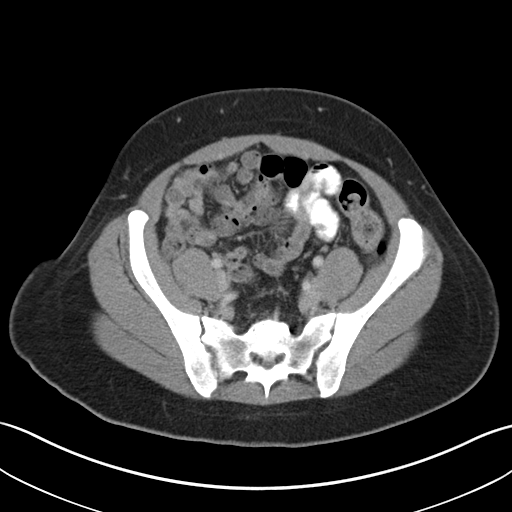
[im 38/86  soft-tissue]
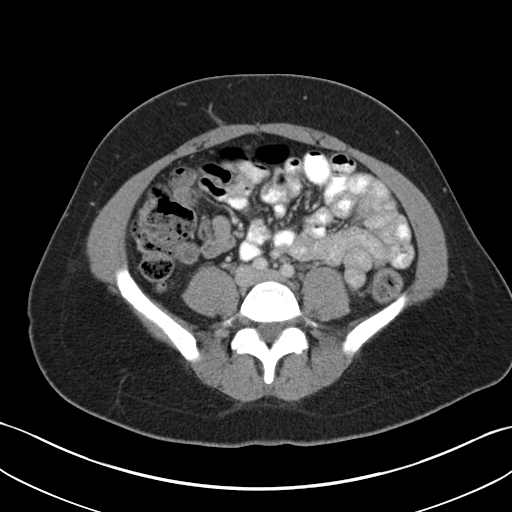
[im 43/86  soft-tissue]
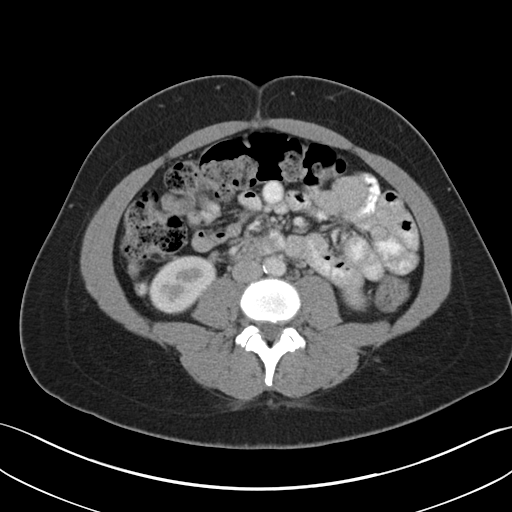
[im 48/86  soft-tissue]
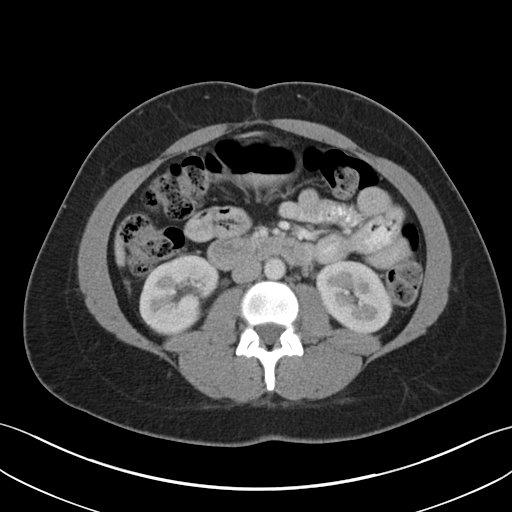
[im 57/86  soft-tissue]
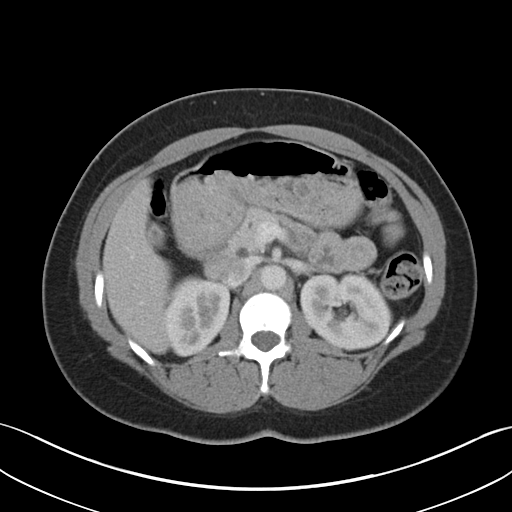
[im 57/86  bone]
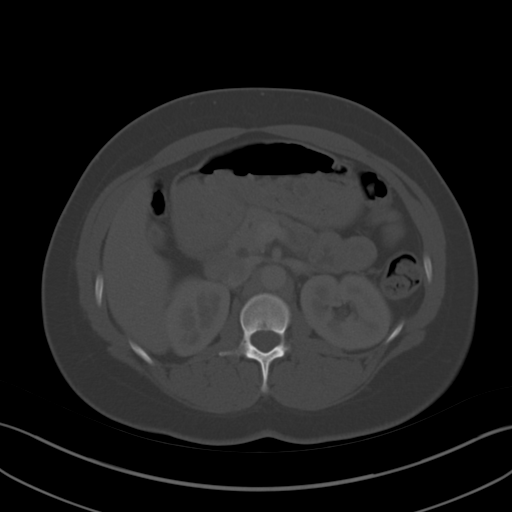
[im 62/86  soft-tissue]
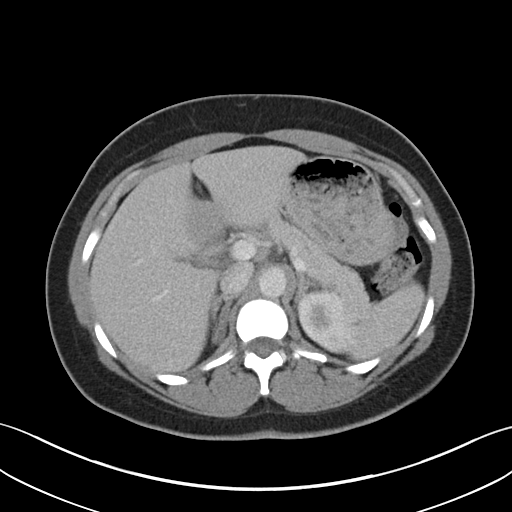
[im 67/86  soft-tissue]
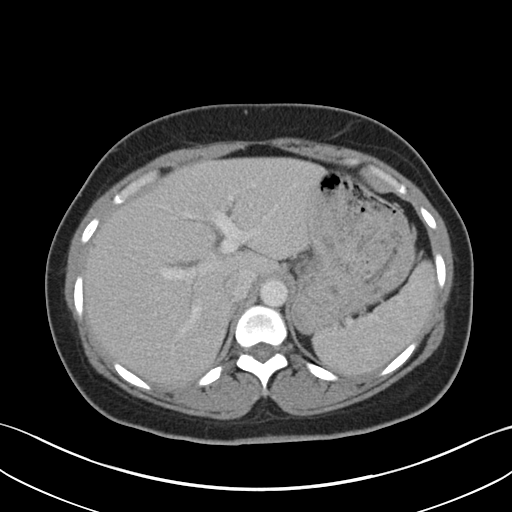
[im 76/86  soft-tissue]
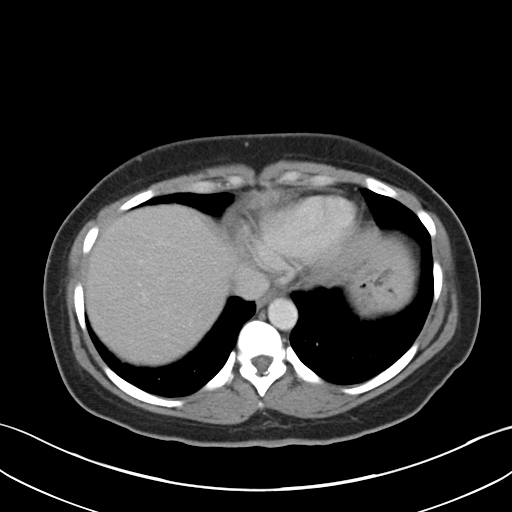
[im 81/86  soft-tissue]
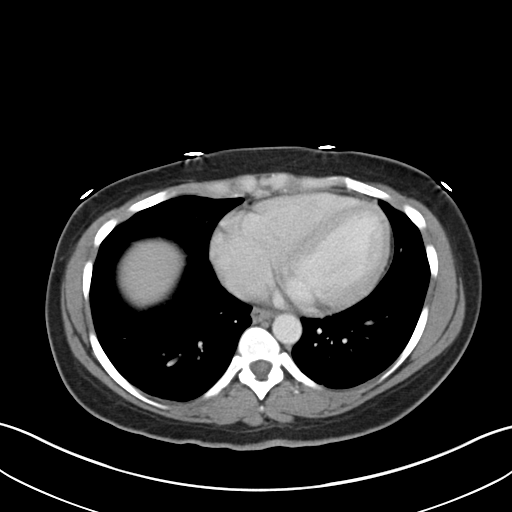

[Series 5: coronal a/|p · coronal · 0.73mm/px · 3 of 137 slices shown]
[im 46/137  soft-tissue]
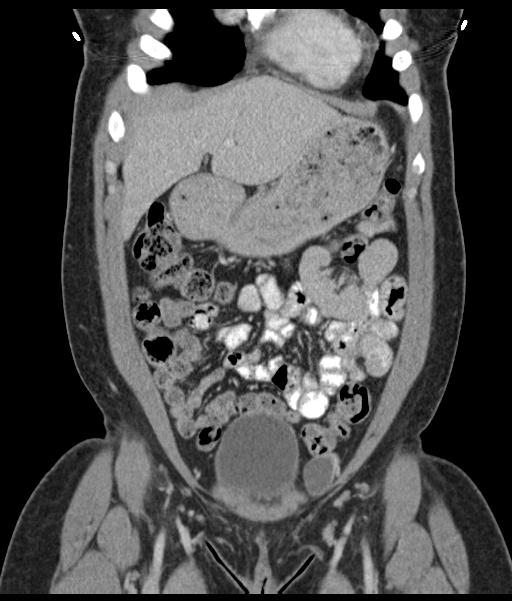
[im 61/137  soft-tissue]
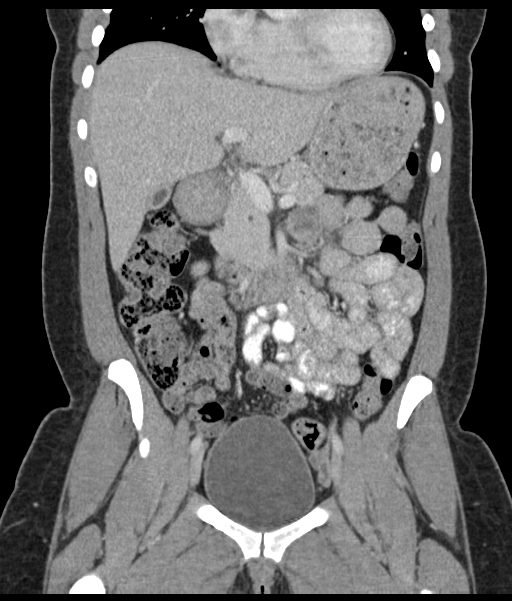
[im 76/137  soft-tissue]
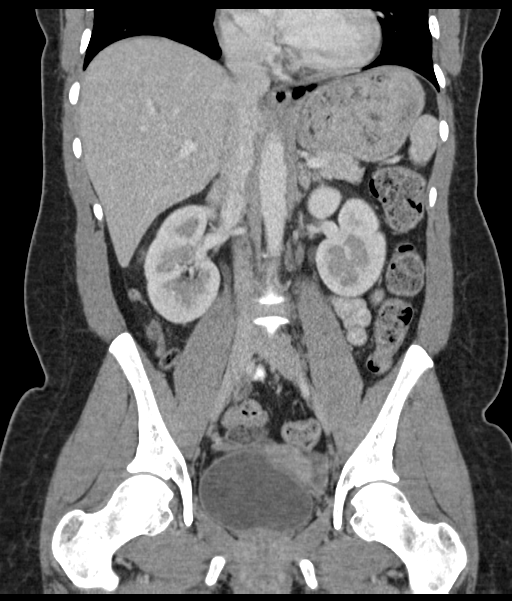

[16 of 46 positions shown; findings below may reference images not displayed]

FINDINGS: Lower chest: No acute abnormality.

Hepatobiliary: No focal liver abnormality is seen. No gallstones,
gallbladder wall thickening, or biliary dilatation.

Pancreas: Unremarkable. No pancreatic ductal dilatation or
surrounding inflammatory changes.

Spleen: Normal in size without focal abnormality.

Adrenals/Urinary Tract: Adrenal glands are unremarkable. Kidneys are
normal, without renal calculi, focal lesion, or hydronephrosis.
Bladder is unremarkable.

Stomach/Bowel: Stomach is within normal limits. Appendix appears
normal. Colon is normal. No evidence of bowel wall thickening,
distention, or inflammatory changes.

Vascular/Lymphatic: Minimal atherosclerotic calcification of the
distal abdominal aorta. No other significant vascular findings are
present. No enlarged abdominal or pelvic lymph nodes.

Reproductive: Bilateral hydrosalpinx. On the right, there is
significant mural enhancement of the hydrosalpinx, which measures up
to 3.9 cm in cross-section. Cannot exclude pyosalpinx.

Uterus is unremarkable.

Other: No ascites. Small fat containing umbilical hernia
incidentally noted.

Musculoskeletal: No acute or significant osseous findings.
IMPRESSION: 1. Normal appendix
2. Bilateral hydrosalpinx, larger on the right where there also is
mural enhancement raising the question of pyosalpinx.

## 2017-07-16 IMAGING — US US PELVIS COMPLETE
1 series · 15 of 25 positions shown · non-contrast
Comparison: CT, 05/21/2016.  Pelvic ultrasound, 09/18/2015.

CLINICAL DATA: Right lower quadrant pain since 05/19/2016. Adnexal
cystic structures noted on a CT performed yesterday.

EXAM:
TRANSABDOMINAL AND TRANSVAGINAL ULTRASOUND OF PELVIS
TECHNIQUE: Both transabdominal and transvaginal ultrasound examinations of the
pelvis were performed. Transabdominal technique was performed for
global imaging of the pelvis including uterus, ovaries, adnexal
regions, and pelvic cul-de-sac. It was necessary to proceed with
endovaginal exam following the transabdominal exam to visualize the
ovaries and adnexa to better advantage.

[Series 1: us pelvis complete · 77 acquisitions, 15 frames shown]
[im 1/77]
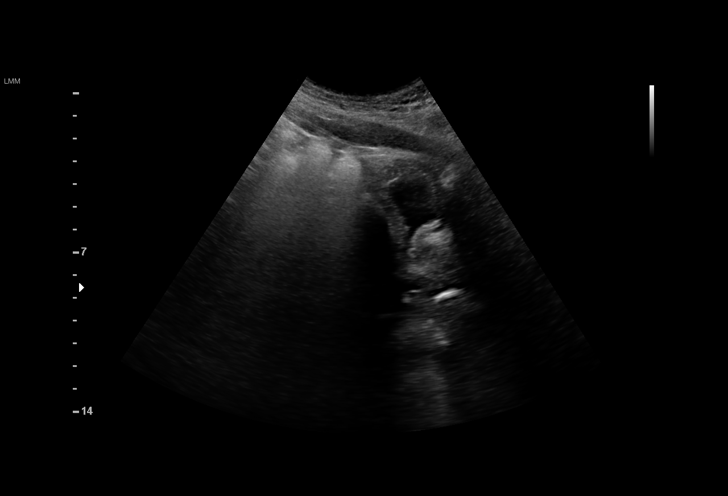
[im 7/77]
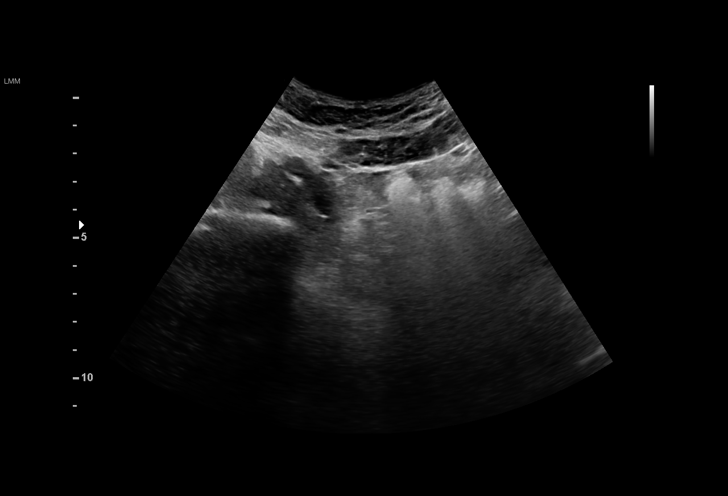
[im 13/77]
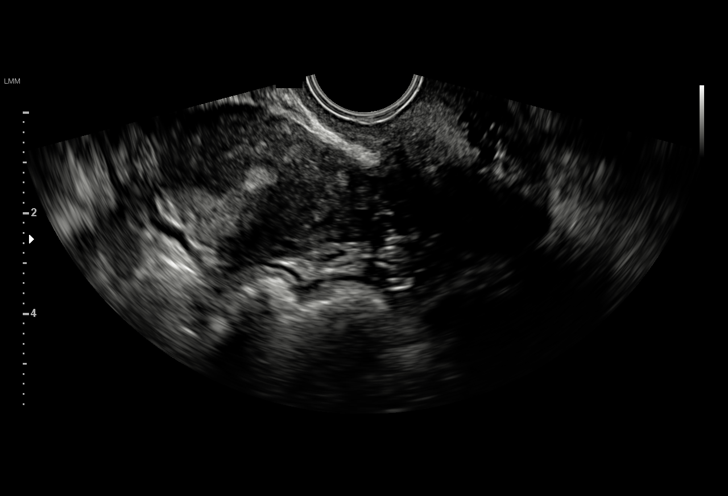
[im 16/77]
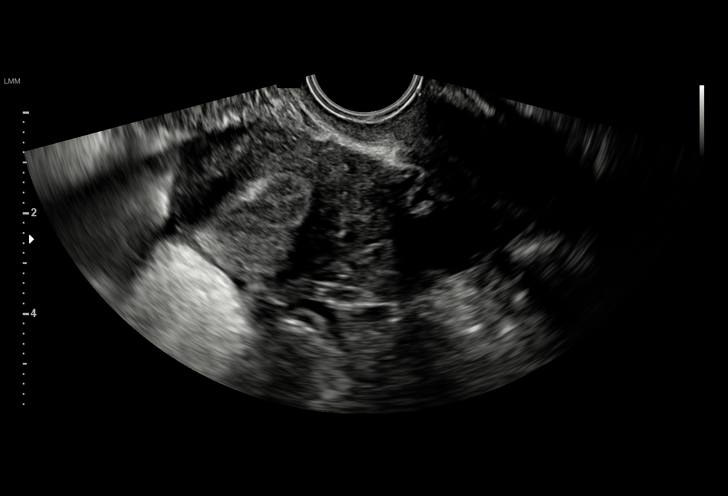
[im 23/77]
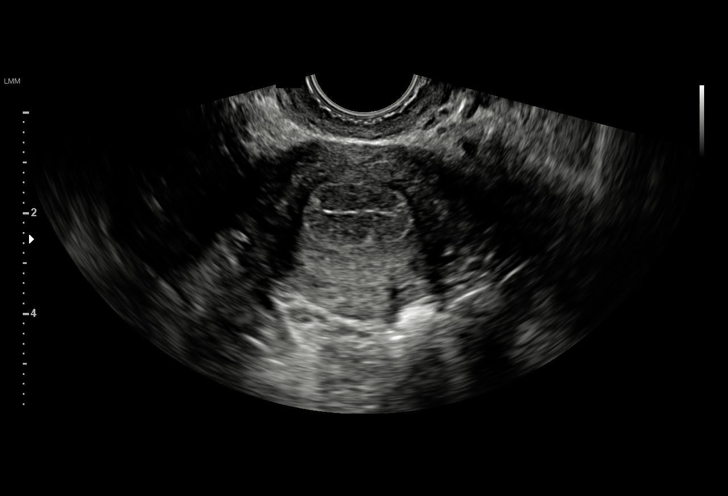
[im 29/77]
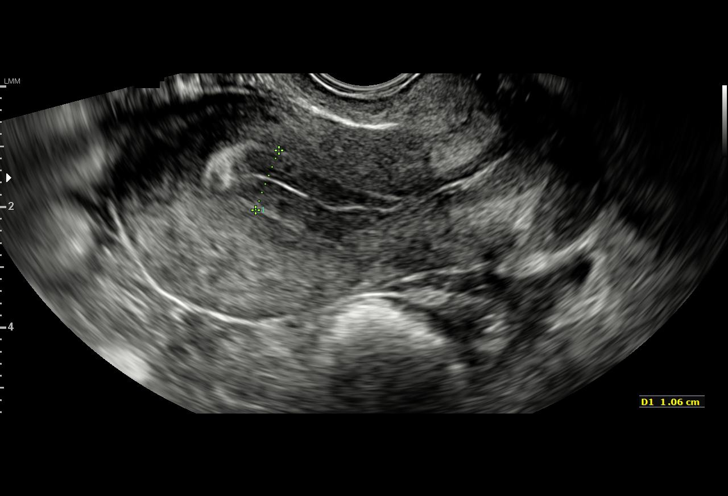
[im 32/77]
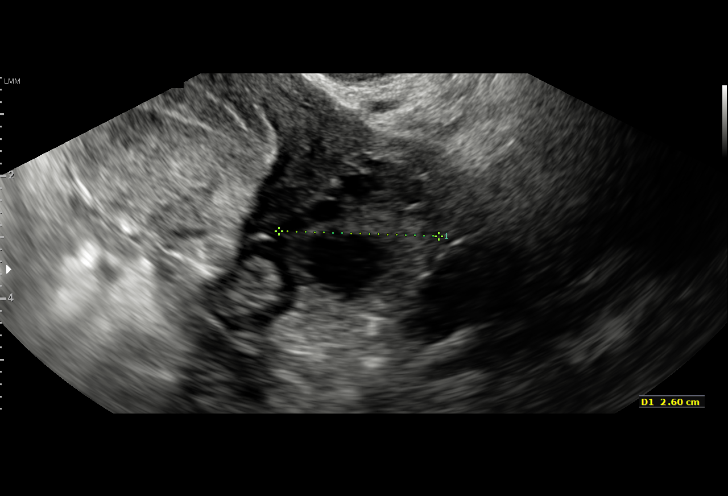
[im 39/77]
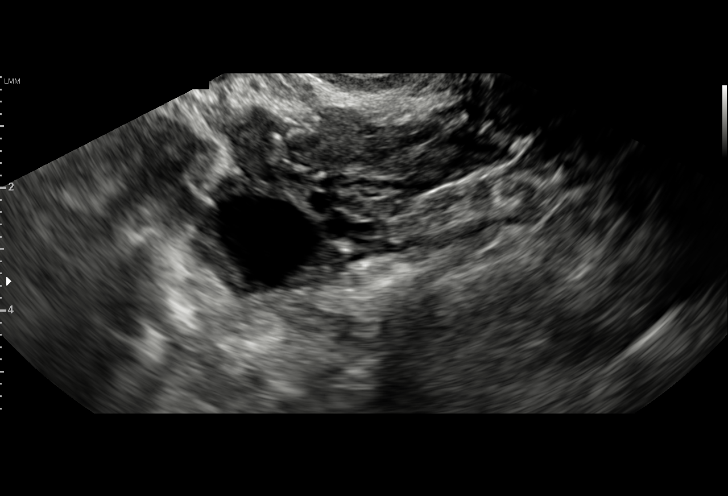
[im 45/77]
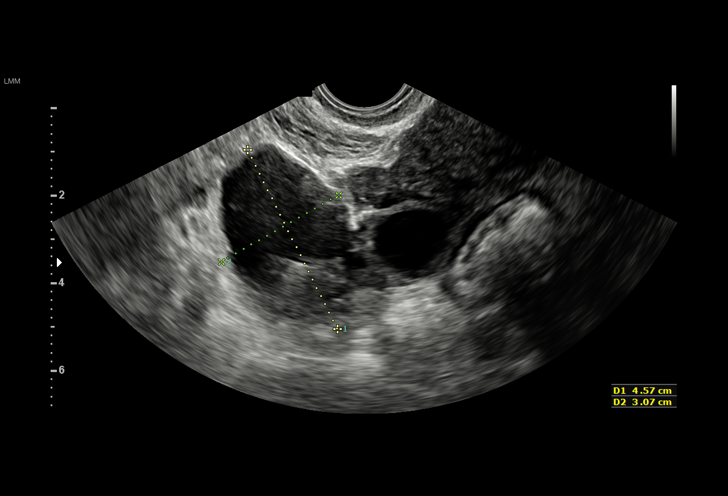
[im 48/77]
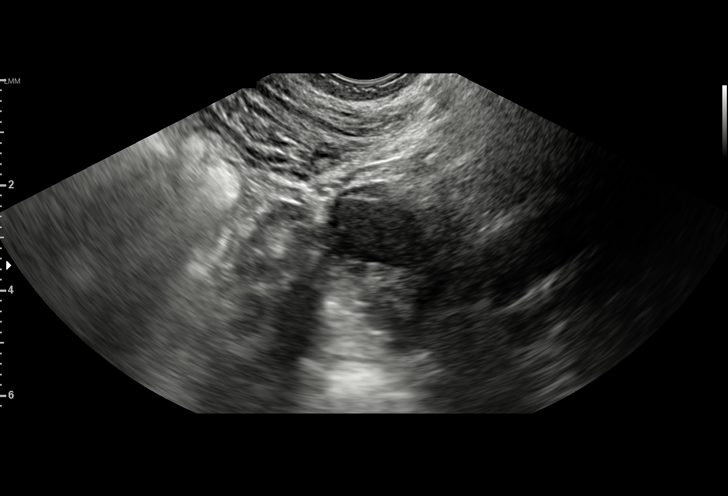
[im 54/77]
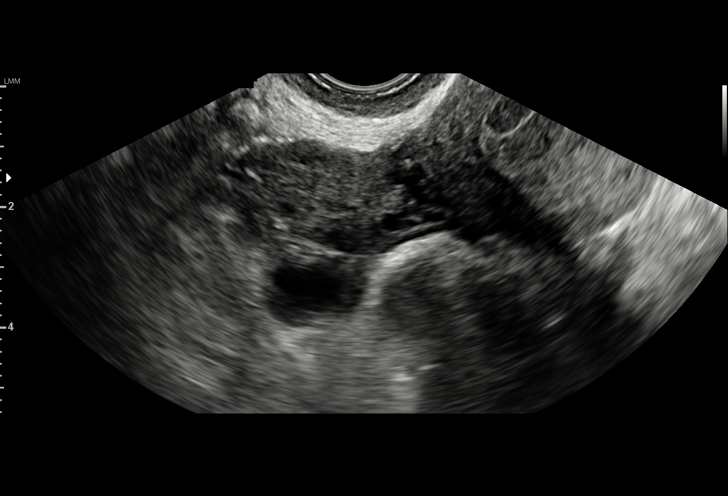
[im 61/77]
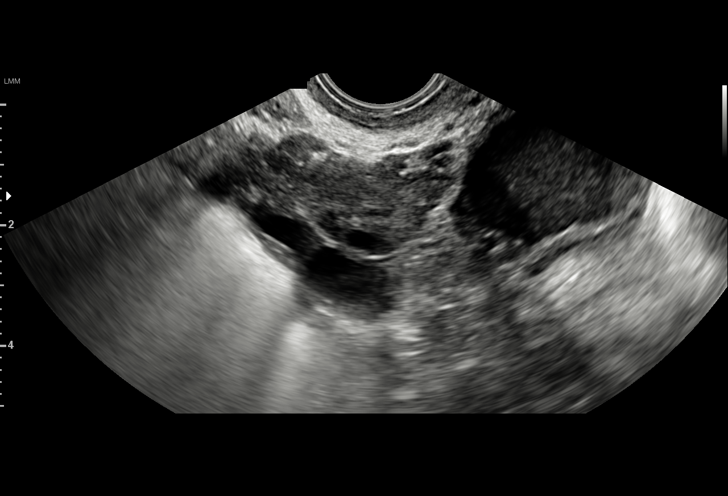
[im 64/77]
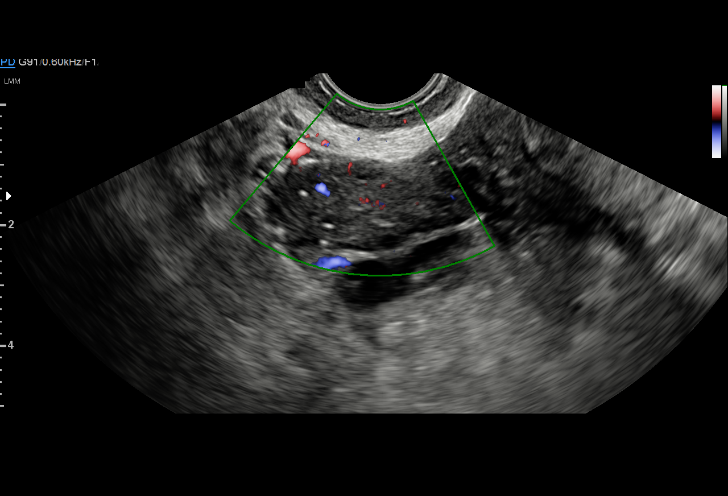
[im 70/77]
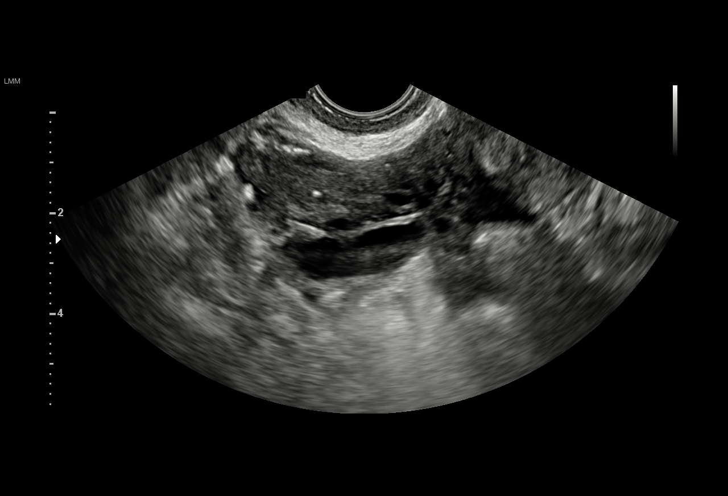
[im 77/77]
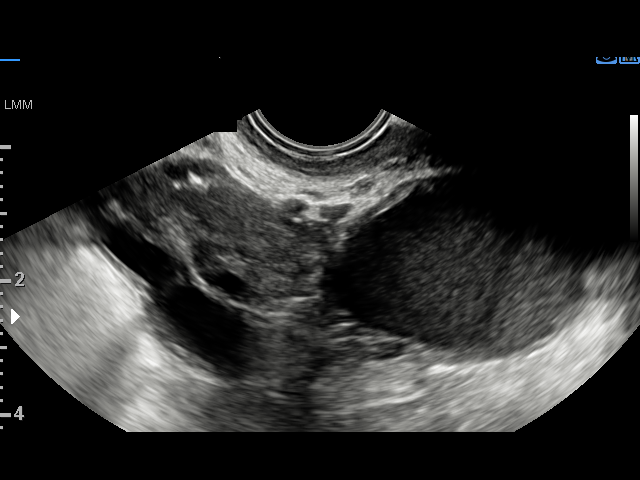

[15 of 25 positions shown; findings below may reference images not displayed]

FINDINGS: Uterus

Measurements: 8.4 x 3.6 x 4.8 cm. No fibroids or other mass
visualized.

Endometrium

Thickness: 10.6 mm.  No focal abnormality visualized.

Right ovary

Measurements: 3.2 x 1.6 x 2.9 cm. Dilated tubular structure with
debris filled fluid along the right adnexa measuring approximate
x 2.7 x 4.3 cm consistent with a hydrosalpinx or possibly
pyosalpinx. This is similar to the prior ultrasound.

Left ovary

Measurements: 3.9 x 2.5 x 2.6 cm. Simple dominant follicular cyst
measuring 2.1 cm. Dilated tubular structure containing debris
extends along the left adnexa measuring approximate 4.6 x 2.8 x
cm. This is consistent with a hydrosalpinx or pyosalpinx.

Other findings

Trace pelvic free fluid
IMPRESSION: 1. Bilateral hydrosalpinx or possibly pyosalpinx. Normal appearance
of the ovaries and uterus.
2. Right hydrosalpinx or pyosalpinx was present on the prior
ultrasound. The left hydrosalpinx or pyosalpinx is new since the
prior ultrasound.
